# Patient Record
Sex: Female | Born: 1940
Health system: Southern US, Community
[De-identification: ages and names within clinical notes are randomized; demographics above are authoritative.]

## PROBLEM LIST (undated history)

## (undated) DIAGNOSIS — I10 Essential (primary) hypertension: Secondary | ICD-10-CM

## (undated) DIAGNOSIS — E039 Hypothyroidism, unspecified: Secondary | ICD-10-CM

## (undated) DIAGNOSIS — M8580 Other specified disorders of bone density and structure, unspecified site: Secondary | ICD-10-CM

## (undated) DIAGNOSIS — M109 Gout, unspecified: Secondary | ICD-10-CM

## (undated) DIAGNOSIS — E782 Mixed hyperlipidemia: Secondary | ICD-10-CM

## (undated) DIAGNOSIS — H8109 Meniere's disease, unspecified ear: Secondary | ICD-10-CM

## (undated) DIAGNOSIS — R7301 Impaired fasting glucose: Secondary | ICD-10-CM

## (undated) DIAGNOSIS — H401290 Low-tension glaucoma, unspecified eye, stage unspecified: Secondary | ICD-10-CM

## (undated) HISTORY — DX: Low-tension glaucoma, unspecified eye, stage unspecified: H40.1290

## (undated) HISTORY — DX: Mixed hyperlipidemia: E78.2

## (undated) HISTORY — DX: Meniere's disease, unspecified ear: H81.09

## (undated) HISTORY — DX: Other specified disorders of bone density and structure, unspecified site: M85.80

## (undated) HISTORY — DX: Gout, unspecified: M10.9

## (undated) HISTORY — DX: Hypothyroidism, unspecified: E03.9

## (undated) HISTORY — DX: Essential (primary) hypertension: I10

## (undated) HISTORY — DX: Impaired fasting glucose: R73.01

## (undated) HISTORY — PX: GANGLION CYST EXCISION: SHX1691

---

## 2008-03-01 LAB — HM COLONOSCOPY

## 2009-04-01 DIAGNOSIS — M109 Gout, unspecified: Secondary | ICD-10-CM

## 2009-04-01 HISTORY — DX: Gout, unspecified: M10.9

## 2010-10-31 HISTORY — PX: ROTATOR CUFF REPAIR: SHX139

## 2011-04-22 ENCOUNTER — Other Ambulatory Visit: Payer: Self-pay | Admitting: Family Medicine

## 2011-04-22 ENCOUNTER — Ambulatory Visit (INDEPENDENT_AMBULATORY_CARE_PROVIDER_SITE_OTHER): Payer: Medicare Other | Admitting: Family Medicine

## 2011-04-22 ENCOUNTER — Encounter: Payer: Self-pay | Admitting: Family Medicine

## 2011-04-22 VITALS — BP 124/78 | HR 76 | Ht 61.0 in | Wt 144.0 lb

## 2011-04-22 DIAGNOSIS — M722 Plantar fascial fibromatosis: Secondary | ICD-10-CM

## 2011-04-22 DIAGNOSIS — R5381 Other malaise: Secondary | ICD-10-CM | POA: Diagnosis not present

## 2011-04-22 DIAGNOSIS — Z780 Asymptomatic menopausal state: Secondary | ICD-10-CM

## 2011-04-22 DIAGNOSIS — Z1231 Encounter for screening mammogram for malignant neoplasm of breast: Secondary | ICD-10-CM | POA: Diagnosis not present

## 2011-04-22 DIAGNOSIS — R635 Abnormal weight gain: Secondary | ICD-10-CM | POA: Diagnosis not present

## 2011-04-22 DIAGNOSIS — Z79899 Other long term (current) drug therapy: Secondary | ICD-10-CM

## 2011-04-22 DIAGNOSIS — E782 Mixed hyperlipidemia: Secondary | ICD-10-CM

## 2011-04-22 DIAGNOSIS — E559 Vitamin D deficiency, unspecified: Secondary | ICD-10-CM | POA: Diagnosis not present

## 2011-04-22 DIAGNOSIS — K219 Gastro-esophageal reflux disease without esophagitis: Secondary | ICD-10-CM

## 2011-04-22 DIAGNOSIS — Z0000 Encounter for general adult medical examination without abnormal findings: Secondary | ICD-10-CM | POA: Diagnosis not present

## 2011-04-22 DIAGNOSIS — I10 Essential (primary) hypertension: Secondary | ICD-10-CM | POA: Insufficient documentation

## 2011-04-22 DIAGNOSIS — M109 Gout, unspecified: Secondary | ICD-10-CM | POA: Diagnosis not present

## 2011-04-22 DIAGNOSIS — Z13820 Encounter for screening for osteoporosis: Secondary | ICD-10-CM

## 2011-04-22 LAB — CBC WITH DIFFERENTIAL/PLATELET
Basophils Absolute: 0 10*3/uL (ref 0.0–0.1)
Eosinophils Relative: 3 % (ref 0–5)
HCT: 44.7 % (ref 36.0–46.0)
Hemoglobin: 14.2 g/dL (ref 12.0–15.0)
Lymphocytes Relative: 31 % (ref 12–46)
Lymphs Abs: 2.1 10*3/uL (ref 0.7–4.0)
MCV: 92.5 fL (ref 78.0–100.0)
Monocytes Absolute: 0.4 10*3/uL (ref 0.1–1.0)
Monocytes Relative: 6 % (ref 3–12)
Neutro Abs: 4 10*3/uL (ref 1.7–7.7)
RBC: 4.83 MIL/uL (ref 3.87–5.11)
RDW: 13.5 % (ref 11.5–15.5)
WBC: 6.8 10*3/uL (ref 4.0–10.5)

## 2011-04-22 LAB — POCT URINALYSIS DIPSTICK
Blood, UA: NEGATIVE
Glucose, UA: NEGATIVE
Ketones, UA: NEGATIVE
Spec Grav, UA: 1.01
Urobilinogen, UA: NEGATIVE

## 2011-04-22 LAB — COMPREHENSIVE METABOLIC PANEL
Albumin: 4.8 g/dL (ref 3.5–5.2)
CO2: 23 mEq/L (ref 19–32)
Glucose, Bld: 80 mg/dL (ref 70–99)
Potassium: 4.1 mEq/L (ref 3.5–5.3)
Sodium: 138 mEq/L (ref 135–145)
Total Protein: 7.1 g/dL (ref 6.0–8.3)

## 2011-04-22 LAB — LIPID PANEL
Cholesterol: 149 mg/dL (ref 0–200)
Triglycerides: 195 mg/dL — ABNORMAL HIGH (ref <150)

## 2011-04-22 LAB — TSH: TSH: 3.027 u[IU]/mL (ref 0.350–4.500)

## 2011-04-22 MED ORDER — NAPROXEN 500 MG PO TABS: 500.0000 mg | ORAL_TABLET | Freq: Two times a day (BID) | ORAL | Status: DC

## 2011-04-22 NOTE — Progress Notes (Signed)
Kaitlyn Abbott is a 71 y.o. female who presents for a complete physical.  She has the following concerns:  Coughs when she eats--more when she eats sweets or carbs.  Denies heartburn or belching.  Denies any coughing or choking with just drinking water.  Hacking cough is immediately better when she finishes eating.  Cough is improved if she takes Prilosec or Pepcid.  L heel pain x 2 months.  Has been doing stretches, which helps some.  She thinks it is plantar fasciitis.  Shoe inserts don't fit in all of her shoes, but seems to help.  Increased pain when walking barefoot.  Sometimes gets sharp heel pain in the middle of the night which wakes her up (has occurred 2-3 times).  Hurts to get out of bed in the morning.  Hyperlipidemia--wants to see if she could possibly change to just 1 pill, rather than 2 rx's.  Previously took Lipitor, was changed to Vytorin, doesn't recall any side effects or problem, perhaps just ineffective. Wondering if she could stop the fenofibrate.  Never took Crestor.  Gout--had 2 episodes 2 years ago, none since being on uloric. Stopped (ran out) of uloric 6 weeks ago, and changed to a black cherry supplement.  No gout attack since off. Wondering if she needs to restart uric acid lowering meds.  If so, would prefer generic med.  Takes Lasix for her Meniere's.  Prior to diagnosis of Meniere's she had been on Lasix for high blood pressure.  When diagnosed with Meniere's dose was increased, and hasn't had any symptoms for 4-5 years.  Dietary changes (cutting back on salt, caffeine, processed meats) have also helped.  HTN--BP at home usually runs 118-120/70's.  Denies dizziness, headaches, muscle cramps.  Immunization History  Administered Date(s) Administered  . Influenza Split 11/29/2010  Had last tetanus (?type) in approx 2009--check old records for date/type when they arrive Never had Zostavax (but had h/o shingles in 2000-2001) Pneumovax 10/2009 per patient Last Pap smear:   03/2010 Last mammogram: 03/2010 Last colonoscopy: 2009.  Denies any h/o polyps Last DEXA: normal per pt, in a study at [facility], 8-10 years ago Dentist: regular Ophtho: every 6-12 months, last was 6 months ago Exercise: limited due to L heel pain, and prior to that was limited by her shoulder.   Past Medical History  Diagnosis Date  . Mixed hyperlipidemia   . Gout attack 2011  . Meniere disease   . Hypertension   . Low tension glaucoma     [facility]    Past Surgical History  Procedure Date  . Ganglion cyst excision bilateral, 20 years apart    bilateral wrists  . Rotator cuff repair 10/2010    Right (Dr. Thurston Hole)    History   Social History  . Marital Status: Married    Spouse Name: N/A    Number of Children: 2  . Years of Education: N/A   Occupational History  . retired Physiological scientist, Doctor, hospital)    Social History Main Topics  . Smoking status: Never Smoker   . Smokeless tobacco: Never Used  . Alcohol Use: Yes     3 drinks a month and occasion glass of wine.  . Drug Use: No  . Sexually Active: Yes -- Female partner(s)   Other Topics Concern  . Not on file   Social History Narrative   Lives with husband.  Daughter lives in [place], son in [place], [place].  No pets    Family History  Problem  Relation Age of Onset  . Diabetes Mother   . Hyperlipidemia Mother   . Hypertension Mother   . Arthritis Mother   . Hypothyroidism Mother   . Cancer Father     kidney CA, and bone CA  . Anxiety disorder Sister   . Hyperlipidemia Sister   . Hypertension Sister   . Arthritis Maternal Grandmother   . Heart disease Maternal Grandmother   . Diabetes Maternal Grandfather   . Heart disease Maternal Grandfather   . Stroke Maternal Grandfather   . Alcohol abuse Sister     Current outpatient prescriptions:bimatoprost (LUMIGAN) 0.01 % SOLN, Place 1 drop into both eyes at bedtime., Disp: , Rfl: ;  ezetimibe-simvastatin (VYTORIN) 10-40 MG per tablet, Take 1 tablet by mouth at  bedtime., Disp: , Rfl: ;  fenofibrate 54 MG tablet, Take 54 mg by mouth daily., Disp: , Rfl: ;  furosemide (LASIX) 20 MG tablet, Take 20 mg by mouth daily., Disp: , Rfl:  Nutritional Supplements CAPS, Take 1 capsule by mouth 2 (two) times daily. [employer] Black Cherry 500mg ., Disp: , Rfl: ;  potassium chloride SA (K-DUR,KLOR-CON) 20 MEQ tablet, Take 20 mEq by mouth daily., Disp: , Rfl: ;  febuxostat (ULORIC) 40 MG tablet, Take 80 mg by mouth daily., Disp: , Rfl:   Allergies  Allergen Reactions  . Penicillins Rash   ROS:  The patient denies anorexia, fever, headaches,  vision changes, decreased hearing, ear pain, sore throat, breast concerns, chest pain, palpitations, dizziness, syncope, dyspnea on exertion, cough, swelling, nausea, vomiting, diarrhea, constipation, abdominal pain, melena, hematochezia, indigestion/heartburn, hematuria, incontinence, dysuria, vaginal bleeding, discharge, or odor, but has slight external vaginal itch.  denies genital lesions,  numbness, tingling, weakness, tremor, suspicious skin lesions, depression, anxiety, abnormal bleeding/bruising, or enlarged lymph nodes.  +10 pound weight gain over last year. +arthritis in her hands, occasionally takes Aleve  PHYSICAL EXAM: BP 148/96  Pulse 76  Ht 5\' 1"  (1.549 m)  Wt 144 lb (65.318 kg)  BMI 27.21 kg/m2 124/78 on repeat General Appearance:    Alert, cooperative, no distress, appears stated age  Head:    Normocephalic, without obvious abnormality, atraumatic  Eyes:    PERRL, conjunctiva/corneas clear, EOM's intact, fundi    benign  Ears:    Normal TM's and external ear canals  Nose:   Nares normal, mucosa normal, no drainage or sinus   tenderness  Throat:   Lips, mucosa, and tongue normal; teeth and gums normal  Neck:   Supple, no lymphadenopathy;  thyroid:  no   enlargement/tenderness/nodules; no carotid   bruit or JVD  Back:    Spine nontender, no curvature, ROM normal, no CVA     tenderness  Lungs:     Clear to  auscultation bilaterally without wheezes, rales or     ronchi; respirations unlabored  Chest Wall:    No tenderness or deformity   Heart:    Regular rate and rhythm, S1 and S2 normal, no murmur, rub   or gallop  Breast Exam:    No tenderness, masses, or nipple discharge or inversion.      No axillary lymphadenopathy  Abdomen:     Soft, non-tender, nondistended, normoactive bowel sounds,    no masses, no hepatosplenomegaly  Genitalia:    Normal external genitalia without lesions.  Mild atrophic changes of external genitalia/labia majora. BUS and vagina normal; no cervical motion tenderness. No abnormal vaginal discharge.  Uterus and adnexa not enlarged, nontender, no masses.  Pap not performed  Rectal:    Normal tone, no masses or tenderness; guaiac negative stool  Extremities:   No clubbing, cyanosis or edema.  Tender at L anteromedial calcaneous.  No soft tissue swelling or warmth  Pulses:   2+ and symmetric all extremities  Skin:   Skin color, texture, turgor normal, no rashes or lesions  Lymph nodes:   Cervical, supraclavicular, and axillary nodes normal  Neurologic:   CNII-XII intact, normal strength, sensation and gait; reflexes 2+ and symmetric throughout          Psych:   Normal mood, affect, hygiene and grooming.    ASSESSMENT/PLAN: 1. Routine general medical examination at a health care facility  Visual acuity screening, POCT Urinalysis Dipstick  2. Weight gain  TSH  3. Other malaise and fatigue  CBC with Differential  4. Mixed hyperlipidemia  Comprehensive metabolic panel, Lipid panel   check baseline labs.  Pt would like to try and take fewer meds  5. Encounter for long-term (current) use of other medications  Comprehensive metabolic panel  6. Essential hypertension, benign     elevated today, but normal at home per history  7. Plantar fasciitis  naproxen (NAPROSYN) 500 MG tablet   left.    8. GERD (gastroesophageal reflux disease)     cough with eating, improved with PPI and  H2 blocker   PF--left.  Trial of NSAIDs, arch supports vs heel cups.  Consider seeing podiatrist for either custom orthotic vs cortisone shot.  Hyperlipidemia--check baseline labs.  If TG are normal, then consider changing to Crestor alone, rechecking lipids (and lfts) in 2-3 months, and if TG aren't at goal, may need to restart fenofibrate.  Gout--off uloric x 6 weeks.  Prefers generic medication if any medication is needed.  Check uric acid. If elevated, and normal kidney function, start allopurinol rather than Uloric.  Cough with eating--likely GERD given that symptoms improve with PPI or H2 blocker. Keep journal, continue PPI or H2 blocker daily for a few weeks.  Discussed monthly self breast exams and yearly mammograms; at least 30 minutes of aerobic activity at least 5 days/week; proper sunscreen use reviewed; healthy diet, including goals of calcium and vitamin D intake and alcohol recommendations (less than or equal to 1 drink/day) reviewed; regular seatbelt use; changing batteries in smoke detectors.  Immunization recommendations discussed. Await previous records for date and type of tetanus.  Shingles vaccine Rx given--to get at pharmacy.  Colonoscopy recommendations reviewed--UTD.  DEXA recommended.

## 2011-04-22 NOTE — Patient Instructions (Signed)

## 2011-04-24 ENCOUNTER — Other Ambulatory Visit: Payer: Self-pay | Admitting: *Deleted

## 2011-04-24 DIAGNOSIS — I10 Essential (primary) hypertension: Secondary | ICD-10-CM

## 2011-04-24 DIAGNOSIS — E559 Vitamin D deficiency, unspecified: Secondary | ICD-10-CM

## 2011-04-24 DIAGNOSIS — E782 Mixed hyperlipidemia: Secondary | ICD-10-CM

## 2011-04-24 DIAGNOSIS — Z79899 Other long term (current) drug therapy: Secondary | ICD-10-CM

## 2011-04-24 MED ORDER — FUROSEMIDE 20 MG PO TABS: 20.0000 mg | ORAL_TABLET | Freq: Every day | ORAL | Status: DC

## 2011-04-24 MED ORDER — POTASSIUM CHLORIDE CRYS ER 20 MEQ PO TBCR: 20.0000 meq | EXTENDED_RELEASE_TABLET | Freq: Every day | ORAL | Status: DC

## 2011-04-24 MED ORDER — ROSUVASTATIN CALCIUM 20 MG PO TABS: 20.0000 mg | ORAL_TABLET | Freq: Every day | ORAL | Status: DC

## 2011-04-24 MED ORDER — ALLOPURINOL 100 MG PO TABS: 100.0000 mg | ORAL_TABLET | Freq: Every day | ORAL | Status: DC

## 2011-04-24 MED ORDER — VITAMIN D (ERGOCALCIFEROL) 1.25 MG (50000 UNIT) PO CAPS: 50000.0000 [IU] | ORAL_CAPSULE | ORAL | Status: DC

## 2011-04-29 NOTE — Progress Notes (Signed)
Addended by: Debbrah Alar F on: 04/29/2011 01:00 PM   Modules accepted: Orders

## 2011-05-21 ENCOUNTER — Encounter: Payer: Self-pay | Admitting: Family Medicine

## 2011-05-21 DIAGNOSIS — H8109 Meniere's disease, unspecified ear: Secondary | ICD-10-CM | POA: Insufficient documentation

## 2011-05-22 ENCOUNTER — Encounter: Payer: Self-pay | Admitting: *Deleted

## 2011-07-05 ENCOUNTER — Ambulatory Visit
Admission: RE | Admit: 2011-07-05 | Discharge: 2011-07-05 | Disposition: A | Discharge status: Home / Self Care | Payer: BC Managed Care – PPO | Source: Ambulatory Visit | Attending: Family Medicine | Admitting: Family Medicine

## 2011-07-05 DIAGNOSIS — Z780 Asymptomatic menopausal state: Secondary | ICD-10-CM

## 2011-07-05 DIAGNOSIS — Z13820 Encounter for screening for osteoporosis: Secondary | ICD-10-CM | POA: Diagnosis not present

## 2011-07-05 DIAGNOSIS — Z1231 Encounter for screening mammogram for malignant neoplasm of breast: Secondary | ICD-10-CM

## 2011-07-05 LAB — HM DEXA SCAN: HM Dexa Scan: NORMAL

## 2011-07-22 ENCOUNTER — Other Ambulatory Visit: Payer: Medicare Other

## 2011-07-22 DIAGNOSIS — E782 Mixed hyperlipidemia: Secondary | ICD-10-CM | POA: Diagnosis not present

## 2011-07-22 DIAGNOSIS — E559 Vitamin D deficiency, unspecified: Secondary | ICD-10-CM

## 2011-07-22 DIAGNOSIS — Z79899 Other long term (current) drug therapy: Secondary | ICD-10-CM

## 2011-07-22 LAB — URIC ACID: Uric Acid, Serum: 5.7 mg/dL (ref 2.4–7.0)

## 2011-07-22 LAB — LIPID PANEL
HDL: 26 mg/dL — ABNORMAL LOW (ref >39)
LDL Cholesterol: 49 mg/dL (ref 0–99)
Total CHOL/HDL Ratio: 5.2 Ratio

## 2011-07-22 LAB — HEPATIC FUNCTION PANEL
AST: 15 U/L (ref 0–37)
Albumin: 4.1 g/dL (ref 3.5–5.2)
Total Bilirubin: 0.5 mg/dL (ref 0.3–1.2)

## 2011-07-25 ENCOUNTER — Encounter: Payer: Self-pay | Admitting: Family Medicine

## 2011-07-25 ENCOUNTER — Ambulatory Visit (INDEPENDENT_AMBULATORY_CARE_PROVIDER_SITE_OTHER): Payer: Medicare Other | Admitting: Family Medicine

## 2011-07-25 VITALS — BP 122/78 | HR 72 | Ht 61.0 in | Wt 150.0 lb

## 2011-07-25 DIAGNOSIS — I10 Essential (primary) hypertension: Secondary | ICD-10-CM

## 2011-07-25 DIAGNOSIS — M899 Disorder of bone, unspecified: Secondary | ICD-10-CM

## 2011-07-25 DIAGNOSIS — M8580 Other specified disorders of bone density and structure, unspecified site: Secondary | ICD-10-CM

## 2011-07-25 DIAGNOSIS — M109 Gout, unspecified: Secondary | ICD-10-CM | POA: Insufficient documentation

## 2011-07-25 DIAGNOSIS — M722 Plantar fascial fibromatosis: Secondary | ICD-10-CM

## 2011-07-25 DIAGNOSIS — E782 Mixed hyperlipidemia: Secondary | ICD-10-CM | POA: Diagnosis not present

## 2011-07-25 DIAGNOSIS — M949 Disorder of cartilage, unspecified: Secondary | ICD-10-CM

## 2011-07-25 MED ORDER — POTASSIUM CHLORIDE CRYS ER 20 MEQ PO TBCR: 20.0000 meq | EXTENDED_RELEASE_TABLET | Freq: Every day | ORAL | Status: DC

## 2011-07-25 MED ORDER — ALLOPURINOL 100 MG PO TABS: 100.0000 mg | ORAL_TABLET | Freq: Every day | ORAL | Status: DC

## 2011-07-25 MED ORDER — FUROSEMIDE 20 MG PO TABS: 20.0000 mg | ORAL_TABLET | Freq: Every day | ORAL | Status: DC

## 2011-07-25 MED ORDER — ALENDRONATE SODIUM 70 MG PO TABS: 70.0000 mg | ORAL_TABLET | ORAL | Status: AC

## 2011-07-25 MED ORDER — NAPROXEN 500 MG PO TABS: 500.0000 mg | ORAL_TABLET | Freq: Two times a day (BID) | ORAL | Status: AC

## 2011-07-25 MED ORDER — FENOFIBRATE 54 MG PO TABS: 54.0000 mg | ORAL_TABLET | Freq: Every day | ORAL | Status: DC

## 2011-07-25 MED ORDER — ROSUVASTATIN CALCIUM 20 MG PO TABS: 20.0000 mg | ORAL_TABLET | Freq: Every day | ORAL | Status: DC

## 2011-07-25 NOTE — Patient Instructions (Signed)
Restart the fenofibrate.  Try increasing fish oil to 3-4 per day if tolerated. Start alendronate to strengthen bones--stop it if causing chest pain, or if you develop difficulty swallowing

## 2011-07-25 NOTE — Progress Notes (Signed)
Patient presents for f/u labs, with complaints of some recurrent L heel pain. Also here to f/u on DEXA results.  Gout--started allopurinol, taking without side effects.  Denies gout flares  Hyperlipidemia, mixed.  Meds changed from Vytorin plus fenofibrate to Crestor 20mg  alone, also taking 1000 mg of fish oil daily.  Denies side effects. Has been following lowfat, low cholesterol diet.  Vitamin D deficiency--completed Rx replacement, and is now taking 1000 IU in addition to her Calcium + D supplement.  L plantar fasciitis--pain was relieved by Naprosyn.  Pain recurred for about the last 3 weeks.  Denies any change in shoes. Pain isn't as severe now as before.   Past Medical History  Diagnosis Date  . Mixed hyperlipidemia   . Gout attack 2011  . Meniere disease   . Hypertension   . Low tension glaucoma     [facility]  . Vitamin d deficiency     Past Surgical History  Procedure Date  . Ganglion cyst excision bilateral, 20 years apart    bilateral wrists  . Rotator cuff repair 10/2010    Right (Dr. Thurston Hole)    History   Social History  . Marital Status: Married    Spouse Name: N/A    Number of Children: 2  . Years of Education: N/A   Occupational History  . retired Physiological scientist, Doctor, hospital)    Social History Main Topics  . Smoking status: Never Smoker   . Smokeless tobacco: Never Used  . Alcohol Use: Yes     3 drinks a month and occasion glass of wine.  . Drug Use: No  . Sexually Active: Yes -- Female partner(s)   Other Topics Concern  . Not on file   Social History Narrative   Lives with husband.  Daughter lives in [place], son in [place], [place].  No pets.  Daughter is Ship broker    Family History  Problem Relation Age of Onset  . Diabetes Mother   . Hyperlipidemia Mother   . Hypertension Mother   . Arthritis Mother   . Hypothyroidism Mother   . Cancer Father     kidney CA, and bone CA  . Anxiety disorder Sister   . Hyperlipidemia Sister   .  Hypertension Sister   . Arthritis Maternal Grandmother   . Heart disease Maternal Grandmother   . Diabetes Maternal Grandfather   . Heart disease Maternal Grandfather   . Stroke Maternal Grandfather   . Alcohol abuse Sister    Current Outpatient Prescriptions on File Prior to Visit  Medication Sig Dispense Refill  . bimatoprost (LUMIGAN) 0.01 % SOLN Place 1 drop into both eyes at bedtime.      . Calcium Carbonate-Vitamin D (RA CALCIUM PLUS VITAMIN D) 600-400 MG-UNIT per tablet Take 1 tablet by mouth daily.      Marland Kitchen DISCONTD: allopurinol (ZYLOPRIM) 100 MG tablet Take 1 tablet (100 mg total) by mouth daily.  30 tablet  3  . DISCONTD: furosemide (LASIX) 20 MG tablet Take 1 tablet (20 mg total) by mouth daily.  90 tablet  0  . DISCONTD: potassium chloride SA (K-DUR,KLOR-CON) 20 MEQ tablet Take 1 tablet (20 mEq total) by mouth daily.  90 tablet  0  . DISCONTD: rosuvastatin (CRESTOR) 20 MG tablet Take 1 tablet (20 mg total) by mouth daily.  30 tablet  3  . DISCONTD: fenofibrate 54 MG tablet Take 54 mg by mouth daily.        Allergies  Allergen  Reactions  . Penicillins Rash    ROS:  Denies fevers, URI symptoms, chest pain, headaches, dizziness, GI complaints, dysphagia, joint pains (other than heel pain), or other problems.  Denies myalgias, skin rash/lesions  PHYSICAL EXAM: BP 122/78  Pulse 72  Ht 5\' 1"  (1.549 m)  Wt 150 lb (68.04 kg)  BMI 28.34 kg/m2 Well developed, pleasant female in no distress Neck: no lymphadenopathy Heart: regular rate and rhythm Lungs: clear bilaterally Abdomen: soft, nontender Extremities: mild tenderness at L plantar fascia insertion at anteromedial calcaneous Skin: no rash Psych: normal mood, affect, hygiene and grooming  Lab Results  Component Value Date   CHOL 134 07/22/2011   HDL 26* 07/22/2011   LDLCALC 49 07/22/2011   TRIG 295* 07/22/2011   CHOLHDL 5.2 07/22/2011   Lab Results  Component Value Date   ALT 12 07/22/2011   AST 15 07/22/2011   ALKPHOS  69 07/22/2011   BILITOT 0.5 07/22/2011   Vitamin D-OH 54 Uric acid 5.7  DEXA Results: ASSESSMENT: Patient's diagnostic category is LOW BONE MASS by WHO  Criteria.  FRACTURE RISK: MODERATE  FRAX: Based on the World Health Organization FRAX model, the 10  year probability of a major osteoporotic fracture is 18% The 10  year probability of a hip fracture is 4.2%.   ASSESSMENT/PLAN: 1. Essential hypertension, benign    2. Gout  allopurinol (ZYLOPRIM) 100 MG tablet  3. Mixed hyperlipidemia  rosuvastatin (CRESTOR) 20 MG tablet, fenofibrate 54 MG tablet, Lipid panel, Hepatic function panel  4. Unspecified essential hypertension  potassium chloride SA (K-DUR,KLOR-CON) 20 MEQ tablet, furosemide (LASIX) 20 MG tablet  5. Plantar fasciitis  naproxen (NAPROSYN) 500 MG tablet   left.    6. Osteopenia  alendronate (FOSAMAX) 70 MG tablet   Osteopenia--at risk for hip fracture.  Risks and benefits of bisphosphonates reviewed.  Discussed weekly vs monthly.  Elects to try alendronate weekly. Discussed importance of Vitamin D and calcium, as well as weight-bearing exercise. Vitamin D deficiency--adequately replaced.  Continue 1000 IU daily longterm.  PF--refill naproxen. F/u with podiatrist  Gout--uric acid level at goal on allopurinol, continue  Mixed hyperlipidemia--LDL at goal, but HDL lower and TG high, high ratio.  Continue Crestor.  Add back fenofibrate at low dose.  Encouraged daily exercise, consider increasing fish oil if tolerated.  Recheck lipids and lfts in 2 months.  OV 6 months Labs 2 months, fasting (letter if normal)

## 2011-07-29 ENCOUNTER — Encounter: Payer: Self-pay | Admitting: Family Medicine

## 2011-09-23 ENCOUNTER — Other Ambulatory Visit: Payer: Medicare Other

## 2011-09-23 ENCOUNTER — Encounter: Payer: Self-pay | Admitting: Family Medicine

## 2011-09-23 DIAGNOSIS — E782 Mixed hyperlipidemia: Secondary | ICD-10-CM

## 2011-09-23 LAB — LIPID PANEL
Cholesterol: 127 mg/dL (ref 0–200)
HDL: 32 mg/dL — ABNORMAL LOW (ref >39)
Total CHOL/HDL Ratio: 4 Ratio
Triglycerides: 207 mg/dL — ABNORMAL HIGH (ref <150)
VLDL: 41 mg/dL — ABNORMAL HIGH (ref 0–40)

## 2011-09-23 LAB — HEPATIC FUNCTION PANEL
ALT: 20 U/L (ref 0–35)
AST: 18 U/L (ref 0–37)
Albumin: 4.2 g/dL (ref 3.5–5.2)
Total Protein: 6.2 g/dL (ref 6.0–8.3)

## 2011-10-22 ENCOUNTER — Other Ambulatory Visit: Payer: Self-pay | Admitting: Family Medicine

## 2011-10-22 NOTE — Telephone Encounter (Signed)
done

## 2011-10-22 NOTE — Telephone Encounter (Signed)
Is this ok?

## 2011-11-05 DIAGNOSIS — H40059 Ocular hypertension, unspecified eye: Secondary | ICD-10-CM | POA: Diagnosis not present

## 2011-11-21 DIAGNOSIS — E119 Type 2 diabetes mellitus without complications: Secondary | ICD-10-CM | POA: Diagnosis not present

## 2011-11-21 DIAGNOSIS — H40019 Open angle with borderline findings, low risk, unspecified eye: Secondary | ICD-10-CM | POA: Diagnosis not present

## 2011-11-21 DIAGNOSIS — E039 Hypothyroidism, unspecified: Secondary | ICD-10-CM | POA: Diagnosis not present

## 2012-01-10 ENCOUNTER — Other Ambulatory Visit: Payer: Medicare Other

## 2012-01-14 ENCOUNTER — Other Ambulatory Visit: Payer: Medicare Other

## 2012-01-14 DIAGNOSIS — Z23 Encounter for immunization: Secondary | ICD-10-CM | POA: Diagnosis not present

## 2012-01-14 MED ORDER — INFLUENZA VIRUS VACC SPLIT PF IM SUSP: 0.5000 mL | Freq: Once | INTRAMUSCULAR | Status: AC

## 2012-01-27 ENCOUNTER — Telehealth: Payer: Self-pay | Admitting: Family Medicine

## 2012-01-27 DIAGNOSIS — E782 Mixed hyperlipidemia: Secondary | ICD-10-CM

## 2012-01-27 MED ORDER — ROSUVASTATIN CALCIUM 20 MG PO TABS: 20.0000 mg | ORAL_TABLET | Freq: Every day | ORAL | Status: DC

## 2012-01-27 NOTE — Telephone Encounter (Signed)
Sent to pharmacy in [place] [place]

## 2012-01-27 NOTE — Telephone Encounter (Signed)
Okay for #90, no refills, to requested pharmacy

## 2012-01-27 NOTE — Telephone Encounter (Signed)
Needs crestor Rx sent to [facility] in [place]  Store [phone]  She is going to be out of town until she sees you 03/23/12

## 2012-01-29 ENCOUNTER — Ambulatory Visit: Payer: Medicare Other | Admitting: Family Medicine

## 2012-03-09 ENCOUNTER — Ambulatory Visit: Payer: Medicare Other | Admitting: Family Medicine

## 2012-03-17 ENCOUNTER — Encounter: Payer: Self-pay | Admitting: Internal Medicine

## 2012-03-23 ENCOUNTER — Encounter: Payer: Self-pay | Admitting: Family Medicine

## 2012-03-23 ENCOUNTER — Ambulatory Visit (INDEPENDENT_AMBULATORY_CARE_PROVIDER_SITE_OTHER): Payer: Medicare Other | Admitting: Family Medicine

## 2012-03-23 VITALS — BP 128/88 | HR 64 | Ht 61.0 in | Wt 153.0 lb

## 2012-03-23 DIAGNOSIS — I10 Essential (primary) hypertension: Secondary | ICD-10-CM | POA: Diagnosis not present

## 2012-03-23 DIAGNOSIS — E559 Vitamin D deficiency, unspecified: Secondary | ICD-10-CM

## 2012-03-23 DIAGNOSIS — Z23 Encounter for immunization: Secondary | ICD-10-CM | POA: Diagnosis not present

## 2012-03-23 DIAGNOSIS — M949 Disorder of cartilage, unspecified: Secondary | ICD-10-CM | POA: Diagnosis not present

## 2012-03-23 DIAGNOSIS — E782 Mixed hyperlipidemia: Secondary | ICD-10-CM

## 2012-03-23 DIAGNOSIS — M899 Disorder of bone, unspecified: Secondary | ICD-10-CM | POA: Diagnosis not present

## 2012-03-23 DIAGNOSIS — M8580 Other specified disorders of bone density and structure, unspecified site: Secondary | ICD-10-CM

## 2012-03-23 LAB — COMPREHENSIVE METABOLIC PANEL
ALT: 20 U/L (ref 0–35)
Alkaline Phosphatase: 32 U/L — ABNORMAL LOW (ref 39–117)
CO2: 24 mEq/L (ref 19–32)
Creat: 0.9 mg/dL (ref 0.50–1.10)
Sodium: 135 mEq/L (ref 135–145)
Total Bilirubin: 0.5 mg/dL (ref 0.3–1.2)
Total Protein: 6.4 g/dL (ref 6.0–8.3)

## 2012-03-23 LAB — LIPID PANEL
Cholesterol: 128 mg/dL (ref 0–200)
LDL Cholesterol: 53 mg/dL (ref 0–99)
Total CHOL/HDL Ratio: 3.8 Ratio
VLDL: 41 mg/dL — ABNORMAL HIGH (ref 0–40)

## 2012-03-23 NOTE — Patient Instructions (Signed)
Continue your current medications.  Dr. Suszanne Conners or the folks at [facility] are local ENT's--not sure who specializes in meniere's but you can check with their offices.

## 2012-03-23 NOTE — Progress Notes (Signed)
Chief Complaint  Patient presents with  . Hypertension    6 month fasting follow up.   Hyperlipidemia--fenofibrate added back at last visit, in addition to Crestor.  Labs in June showed improvement, but TG still high at 207.  Due for recheck today. Denies myalgias.  A little stiffness in legs only when she first wakes in the morning, better once she moves around. Trying to follow lowfat, low cholesterol diet.  Gout--on allopurinol, and hasn't had any flares since prior to last visit.  Osteopenia--started on alendronate at last visit, and it tolerating it without any side effects.  Denies dysphagia, chest pain.  Had R hip pain while visiting her son in [place].  She got new shoes after being evaluated at running shoe store, and has been doing fine.  Rarely needs an aleve, which helps. No further problems with plantar fasciitis.  Vitamin D deficiency--taking it with her calcium, and is in her fish oil, and she takes separate vitamin D.  This is more than what she was taking at her last check, coming in at 4000 IU daily. Her fish oil contains 1000 IU in each, taking 2 daily, and has been taking x 3-4 months.  HTN--she was put on Lasix to control both her blood pressure and her meniere's disease.  She follows a low sodium diet. BP's at home are running 117-120's/70's.  Meniere's has been okay.  She is asking for recommendations for ENT's.  HM--can't recall last tetanus, many years.  None documented in previous records received/reviewed.  Past Medical History  Diagnosis Date  . Mixed hyperlipidemia   . Gout attack 2011  . Meniere disease   . Hypertension   . Low tension glaucoma     [facility]  . Vitamin D deficiency    Past Surgical History  Procedure Date  . Ganglion cyst excision bilateral, 20 years apart    bilateral wrists  . Rotator cuff repair 10/2010    Right (Dr. Thurston Hole)   History   Social History  . Marital Status: Married    Spouse Name: N/A    Number of Children: 2  .  Years of Education: N/A   Occupational History  . retired Physiological scientist, Doctor, hospital)    Social History Main Topics  . Smoking status: Never Smoker   . Smokeless tobacco: Never Used  . Alcohol Use: Yes     Comment: 3 drinks a month and occasion glass of wine.  . Drug Use: No  . Sexually Active: Yes -- Female partner(s)   Other Topics Concern  . Not on file   Social History Narrative   Lives with husband.  Daughter lives in [place], son in [place], [place].  No pets.  Daughter is Ship broker    Current outpatient prescriptions:alendronate (FOSAMAX) 70 MG tablet, Take 1 tablet (70 mg total) by mouth every 7 (seven) days. Take with a full glass of water on an empty stomach., Disp: 4 tablet, Rfl: 11;  allopurinol (ZYLOPRIM) 100 MG tablet, Take 1 tablet (100 mg total) by mouth daily., Disp: 90 tablet, Rfl: 3;  bimatoprost (LUMIGAN) 0.01 % SOLN, Place 1 drop into both eyes at bedtime., Disp: , Rfl:  Calcium Carbonate-Vitamin D (RA CALCIUM PLUS VITAMIN D) 600-400 MG-UNIT per tablet, Take 1 tablet by mouth daily., Disp: , Rfl: ;  cholecalciferol (VITAMIN D) 1000 UNITS tablet, Take 1,000 Units by mouth daily., Disp: , Rfl: ;  fenofibrate 54 MG tablet, TAKE 1 TABLET (54 MG TOTAL) BY MOUTH DAILY.,  Disp: 90 tablet, Rfl: 1;  Fish Oil-Cholecalciferol (FISH OIL-VITAMIN D PO), Take 2,400 mg by mouth daily., Disp: , Rfl:  furosemide (LASIX) 20 MG tablet, Take 1 tablet (20 mg total) by mouth daily., Disp: 90 tablet, Rfl: 3;  potassium chloride SA (K-DUR,KLOR-CON) 20 MEQ tablet, Take 1 tablet (20 mEq total) by mouth daily., Disp: 90 tablet, Rfl: 3;  rosuvastatin (CRESTOR) 20 MG tablet, Take 1 tablet (20 mg total) by mouth daily., Disp: 90 tablet, Rfl: 0 naproxen (NAPROSYN) 500 MG tablet, Take 1 tablet (500 mg total) by mouth 2 (two) times daily with a meal., Disp: 30 tablet, Rfl: 1 No current facility-administered medications for this visit. Facility-Administered Medications Ordered in Other Visits: influenza   inactive virus vaccine (FLUZONE/FLUARIX) injection 0.5 mL, 0.5 mL, Intramuscular, Once, Ronnald Nian, MD  Allergies  Allergen Reactions  . Penicillins Rash   ROS negative--some hearing loss in L ear.  No fevers, URI symptoms, headaches, chest pain, palpitations, shortness of breath, nausea, vomiting bowel changes, urinary complaints, skin rashes, bleeding/bruising, fatigue, or other concerns.  See HPI  PHYSICAL EXAM: BP 128/88  Pulse 64  Ht 5\' 1"  (1.549 m)  Wt 153 lb (69.4 kg)  BMI 28.91 kg/m2 Well developed, pleasant female in no distress Neck: no lymphadenopathy, thyromegaly or carotid bruit Heart: regular rate and rhythm without murmur Lungs: clear bilaterally Abdomen: soft, nontender, no organomegaly or mass Extremities: no edema, 2+ pulse Psych: normal mood, affect, hygiene and grooming Neuro: alert and oriented, cranial nerves grossly intact.  Normal gait, strength Skin: no rash  ASSESSMENT/PLAN:  1. Mixed hyperlipidemia  Lipid panel  2. Essential hypertension, benign  Comprehensive metabolic panel, Lipid panel  3. Osteopenia    4. Need for Tdap vaccination  Tdap vaccine greater than or equal to 7yo IM  5. Unspecified vitamin D deficiency  Vitamin D 25 hydroxy   Vitamin D ---taking significantly more than at last check, when D-OH was 54.  Let's make sure it isn't too much.  If significantly higher, she should cut out the regular Vitamin D while on fish oil containing Vitamin D.  CPE 6 months (med check plus/AWV)

## 2012-03-24 ENCOUNTER — Encounter: Payer: Self-pay | Admitting: Family Medicine

## 2012-03-24 MED ORDER — FENOFIBRATE 54 MG PO TABS: 54.0000 mg | ORAL_TABLET | Freq: Every day | ORAL | Status: DC

## 2012-03-24 MED ORDER — ROSUVASTATIN CALCIUM 20 MG PO TABS: 20.0000 mg | ORAL_TABLET | Freq: Every day | ORAL | Status: DC

## 2012-07-22 ENCOUNTER — Other Ambulatory Visit: Payer: Self-pay | Admitting: Family Medicine

## 2012-08-18 ENCOUNTER — Other Ambulatory Visit: Payer: Self-pay

## 2012-08-18 DIAGNOSIS — Z1231 Encounter for screening mammogram for malignant neoplasm of breast: Secondary | ICD-10-CM

## 2012-08-28 ENCOUNTER — Other Ambulatory Visit: Payer: Self-pay | Admitting: Family Medicine

## 2012-08-28 NOTE — Telephone Encounter (Signed)
CHECK WITH DR. Lynelle Doctor FOR FURTHER REFILLS.

## 2012-09-04 ENCOUNTER — Ambulatory Visit
Admission: RE | Admit: 2012-09-04 | Discharge: 2012-09-04 | Disposition: A | Discharge status: Home / Self Care | Payer: Medicare Other | Source: Ambulatory Visit

## 2012-09-04 DIAGNOSIS — Z1231 Encounter for screening mammogram for malignant neoplasm of breast: Secondary | ICD-10-CM | POA: Diagnosis not present

## 2012-09-04 LAB — HM MAMMOGRAPHY: HM Mammogram: NEGATIVE

## 2012-09-25 ENCOUNTER — Other Ambulatory Visit: Payer: Self-pay | Admitting: Family Medicine

## 2012-10-05 ENCOUNTER — Encounter: Payer: Self-pay | Admitting: Family Medicine

## 2012-10-05 ENCOUNTER — Ambulatory Visit (INDEPENDENT_AMBULATORY_CARE_PROVIDER_SITE_OTHER): Payer: Medicare Other | Admitting: Family Medicine

## 2012-10-05 ENCOUNTER — Other Ambulatory Visit (HOSPITAL_COMMUNITY)
Admission: RE | Admit: 2012-10-05 | Discharge: 2012-10-05 | Disposition: A | Discharge status: Home / Self Care | Payer: Medicare Other | Source: Ambulatory Visit | Attending: Family Medicine | Admitting: Family Medicine

## 2012-10-05 VITALS — BP 130/80 | HR 80 | Ht 61.0 in | Wt 154.0 lb

## 2012-10-05 DIAGNOSIS — Z79899 Other long term (current) drug therapy: Secondary | ICD-10-CM | POA: Diagnosis not present

## 2012-10-05 DIAGNOSIS — E782 Mixed hyperlipidemia: Secondary | ICD-10-CM | POA: Diagnosis not present

## 2012-10-05 DIAGNOSIS — Z0000 Encounter for general adult medical examination without abnormal findings: Secondary | ICD-10-CM

## 2012-10-05 DIAGNOSIS — I10 Essential (primary) hypertension: Secondary | ICD-10-CM

## 2012-10-05 DIAGNOSIS — Z01419 Encounter for gynecological examination (general) (routine) without abnormal findings: Secondary | ICD-10-CM | POA: Diagnosis not present

## 2012-10-05 DIAGNOSIS — K219 Gastro-esophageal reflux disease without esophagitis: Secondary | ICD-10-CM

## 2012-10-05 DIAGNOSIS — M8580 Other specified disorders of bone density and structure, unspecified site: Secondary | ICD-10-CM

## 2012-10-05 DIAGNOSIS — Z1151 Encounter for screening for human papillomavirus (HPV): Secondary | ICD-10-CM | POA: Insufficient documentation

## 2012-10-05 DIAGNOSIS — H8109 Meniere's disease, unspecified ear: Secondary | ICD-10-CM | POA: Diagnosis not present

## 2012-10-05 DIAGNOSIS — M109 Gout, unspecified: Secondary | ICD-10-CM

## 2012-10-05 DIAGNOSIS — M949 Disorder of cartilage, unspecified: Secondary | ICD-10-CM

## 2012-10-05 LAB — COMPREHENSIVE METABOLIC PANEL
Alkaline Phosphatase: 34 U/L — ABNORMAL LOW (ref 39–117)
BUN: 28 mg/dL — ABNORMAL HIGH (ref 6–23)
Glucose, Bld: 96 mg/dL (ref 70–99)
Sodium: 138 mEq/L (ref 135–145)
Total Bilirubin: 0.5 mg/dL (ref 0.3–1.2)
Total Protein: 6.7 g/dL (ref 6.0–8.3)

## 2012-10-05 LAB — LIPID PANEL
Cholesterol: 140 mg/dL (ref 0–200)
Total CHOL/HDL Ratio: 3.9 Ratio
VLDL: 36 mg/dL (ref 0–40)

## 2012-10-05 LAB — HM PAP SMEAR: HM Pap smear: NEGATIVE

## 2012-10-05 MED ORDER — ALENDRONATE SODIUM 70 MG PO TABS: ORAL_TABLET | ORAL | Status: DC

## 2012-10-05 MED ORDER — FENOFIBRATE 54 MG PO TABS: 54.0000 mg | ORAL_TABLET | Freq: Every day | ORAL | Status: DC

## 2012-10-05 MED ORDER — POTASSIUM CHLORIDE CRYS ER 20 MEQ PO TBCR: 20.0000 meq | EXTENDED_RELEASE_TABLET | Freq: Every day | ORAL | Status: DC

## 2012-10-05 MED ORDER — ALLOPURINOL 100 MG PO TABS: ORAL_TABLET | ORAL | Status: DC

## 2012-10-05 MED ORDER — FUROSEMIDE 20 MG PO TABS: 20.0000 mg | ORAL_TABLET | Freq: Every day | ORAL | Status: DC

## 2012-10-05 NOTE — Patient Instructions (Addendum)

## 2012-10-05 NOTE — Progress Notes (Signed)
Chief Complaint  Patient presents with  . Med check plus    fasting med check plus with pelvic exam. No concerns.   Kaitlyn Abbott is a 72 y.o. female who presents for a complete physical.  She is also here for routine med check on her chronic medical problems.  She is fasting today for labs.  Hyperlipidemia follow-up:  Patient is reportedly following a low-fat, low cholesterol diet.  Compliant with medications and denies medication side effects  Hypertension follow-up:  Blood pressures elsewhere are 120's/70's.  Denies lightheadedness, headaches, chest pain.  Denies side effects of medications.  Takes Lasix for her Meniere's. Prior to diagnosis of Meniere's she had been on Lasix for high blood pressure. When diagnosed with Meniere's dose was increased, and hasn't had any symptoms for several years. Dietary changes (cutting back on salt, caffeine, processed meats) have also helped.  Gout: doing well on allopurinol.  Hasn't had a gout flare since 1.5-2 years ago.  Osteopenia:  On fosamax x 1.5 years.  Denies chest pain, jaw pain, or dysphagia.  GERD--no longer having cough with eating since taking omeprazole daily.  Can go a day or two without it and doesn't have recurrent symptoms.  She has Living Will and healthcare power of attorney.    Immunization History  Administered Date(s) Administered  . Influenza Split 11/29/2010, 01/14/2012  . Pneumococcal Polysaccharide 01/24/2010  . Tdap 03/23/2012  Never had Zostavax (but had h/o shingles in 2000-2001) --has been given rx, but never got vaccine Pneumovax 10/2009 per patient  Last Pap smear: 03/2010  Last mammogram: 08/2012 Last colonoscopy: 2009. Denies any h/o polyps  Last DEXA: 07/2011 (with elevated FRAX, T-2; alendronate started) Dentist: regular  Ophtho: every 6 months Exercise: walks most days/week, 45-60 minutes (4x/week).  Past Medical History  Diagnosis Date  . Mixed hyperlipidemia   . Gout attack 2011  . Meniere disease   .  Hypertension   . Low tension glaucoma     [facility]  . Vitamin D deficiency   . Osteopenia     Past Surgical History  Procedure Laterality Date  . Ganglion cyst excision  bilateral, 20 years apart    bilateral wrists  . Rotator cuff repair  10/2010    Right (Dr. Thurston Hole)    History   Social History  . Marital Status: Married    Spouse Name: N/A    Number of Children: 2  . Years of Education: N/A   Occupational History  . retired Physiological scientist, Doctor, hospital)    Social History Main Topics  . Smoking status: Never Smoker   . Smokeless tobacco: Never Used  . Alcohol Use: Yes     Comment: 3 drinks a month and occasion glass of wine.  . Drug Use: No  . Sexually Active: Yes -- Female partner(s)   Other Topics Concern  . Not on file   Social History Narrative   Lives with husband.  Daughter lives in [place], son in [place], [place].  No pets.  Daughter is Ship broker    Family History  Problem Relation Age of Onset  . Diabetes Mother   . Hyperlipidemia Mother   . Hypertension Mother   . Arthritis Mother   . Hypothyroidism Mother   . Cancer Father     kidney CA, and bone CA  . Anxiety disorder Sister   . Hyperlipidemia Sister   . Hypertension Sister   . Arthritis Maternal Grandmother   . Heart disease Maternal Grandmother   .  Diabetes Maternal Grandfather   . Heart disease Maternal Grandfather   . Stroke Maternal Grandfather   . Alcohol abuse Sister     Current outpatient prescriptions:alendronate (FOSAMAX) 70 MG tablet, TAKE 1 TABLET BY MOUTH EVERY 7 DAYS WITH A FULL GLASS OF WATER. TAKE ON AN EMPTY STOMACH, Disp: 12 tablet, Rfl: 3;  allopurinol (ZYLOPRIM) 100 MG tablet, TAKE 1 TABLET (100 MG TOTAL) BY MOUTH DAILY., Disp: 90 tablet, Rfl: 3;  bimatoprost (LUMIGAN) 0.01 % SOLN, Place 1 drop into both eyes at bedtime., Disp: , Rfl:  Calcium Carbonate-Vit D-Min (CALCIUM 1200) 1200-1000 MG-UNIT CHEW, Chew 1 tablet by mouth daily., Disp: , Rfl: ;  Cholecalciferol (VITAMIN  D) 1000 UNITS capsule, Take 1,000 Units by mouth daily., Disp: , Rfl: ;  CRESTOR 20 MG tablet, TAKE 1 TABLET (20 MG TOTAL) BY MOUTH DAILY., Disp: 90 tablet, Rfl: 1;  fenofibrate 54 MG tablet, Take 1 tablet (54 mg total) by mouth daily., Disp: 90 tablet, Rfl: 1 furosemide (LASIX) 20 MG tablet, Take 1 tablet (20 mg total) by mouth daily., Disp: 90 tablet, Rfl: 3;  Omega-3 Fatty Acids (FISH OIL) 300 MG CAPS, Take 600 mg by mouth daily., Disp: , Rfl: ;  omeprazole (PRILOSEC) 20 MG capsule, Take 20 mg by mouth daily., Disp: , Rfl: ;  potassium chloride SA (K-DUR,KLOR-CON) 20 MEQ tablet, Take 1 tablet (20 mEq total) by mouth daily., Disp: 90 tablet, Rfl: 3 No current facility-administered medications for this visit. Facility-Administered Medications Ordered in Other Visits: influenza  inactive virus vaccine (FLUZONE/FLUARIX) injection 0.5 mL, 0.5 mL, Intramuscular, Once, Ronnald Nian, MD  Allergies  Allergen Reactions  . Penicillins Rash   ROS: The patient denies anorexia, fever, headaches, vision changes, decreased hearing, ear pain, sore throat, breast concerns, chest pain, palpitations, dizziness, syncope, dyspnea on exertion, cough, swelling, nausea, vomiting, diarrhea, constipation, abdominal pain, melena, hematochezia, indigestion/heartburn, hematuria, incontinence, dysuria, vaginal bleeding, discharge, or odor, denies genital lesions, numbness, tingling, weakness, tremor, suspicious skin lesions, depression, anxiety, abnormal bleeding/bruising, or enlarged lymph nodes.   +arthritis in her hands, occasionally takes Aleve   PHYSICAL EXAM: BP 130/80  Pulse 80  Ht 5\' 1"  (1.549 m)  Wt 154 lb (69.854 kg)  BMI 29.11 kg/m2  General Appearance:  Alert, cooperative, no distress, appears stated age   Head:  Normocephalic, without obvious abnormality, atraumatic   Eyes:  PERRL, conjunctiva/corneas clear, EOM's intact, fundi  benign   Ears:  Normal TM's and external ear canals   Nose:  Nares normal,  mucosa normal, no drainage or sinus tenderness   Throat:  Lips, mucosa, and tongue normal; teeth and gums normal   Neck:  Supple, no lymphadenopathy; thyroid: no enlargement/tenderness/nodules; no carotid  bruit or JVD   Back:  Spine nontender, no curvature, ROM normal, no CVA tenderness   Lungs:  Clear to auscultation bilaterally without wheezes, rales or ronchi; respirations unlabored   Chest Wall:  No tenderness or deformity   Heart:  Regular rate and rhythm, S1 and S2 normal, no murmur, rub  or gallop   Breast Exam:  No tenderness, masses, or nipple discharge or inversion. No axillary lymphadenopathy   Abdomen:  Soft, non-tender, nondistended, normoactive bowel sounds,  no masses, no hepatosplenomegaly   Genitalia:  Normal external genitalia without lesions. Mild atrophic changes of external genitalia/labia majora. BUS and vagina normal; no cervical motion tenderness. No abnormal vaginal discharge. Uterus and adnexa not enlarged, nontender, no masses. cervix normal without lesions. Pap performed   Rectal:  Normal  tone, no masses or tenderness; guaiac negative stool   Extremities:  No clubbing, cyanosis or edema. Tender at L anteromedial calcaneous. No soft tissue swelling or warmth   Pulses:  2+ and symmetric all extremities   Skin:  Skin color, texture, turgor normal, no rashes or lesions   Lymph nodes:  Cervical, supraclavicular, and axillary nodes normal   Neurologic:  CNII-XII intact, normal strength, sensation and gait; reflexes 2+ and symmetric throughout          Psych: Normal mood, affect, hygiene and grooming.   ASSESSMENT/PLAN:   Discussed monthly self breast exams and yearly mammograms; at least 30 minutes of aerobic activity at least 5 days/week; proper sunscreen use reviewed; healthy diet, including goals of calcium and vitamin D intake and alcohol recommendations (less than or equal to 1 drink/day) reviewed; regular seatbelt use; changing batteries in smoke detectors.  Immunization recommendations discussed.  Colonoscopy recommendations reviewed--UTD.   Pt reports that her insurance has always covered physicals, and 04/2011 was covered.   Reviewed shingles vaccine again.  6 months

## 2012-10-06 ENCOUNTER — Encounter: Payer: Self-pay | Admitting: Family Medicine

## 2012-10-22 ENCOUNTER — Other Ambulatory Visit: Payer: Self-pay | Admitting: Family Medicine

## 2012-11-04 DIAGNOSIS — H40019 Open angle with borderline findings, low risk, unspecified eye: Secondary | ICD-10-CM | POA: Diagnosis not present

## 2012-11-05 ENCOUNTER — Telehealth: Payer: Self-pay | Admitting: Family Medicine

## 2012-11-05 NOTE — Telephone Encounter (Signed)
Called pharmacy and this was an error.

## 2012-11-05 NOTE — Telephone Encounter (Signed)
rx was done in July for #90 with 3 refills.  Shouldn't need

## 2012-11-05 NOTE — Telephone Encounter (Signed)
Rx refill request received from [facility]   Furosemide 20 mg  90 day supply

## 2012-12-24 DIAGNOSIS — H40019 Open angle with borderline findings, low risk, unspecified eye: Secondary | ICD-10-CM | POA: Diagnosis not present

## 2013-01-26 ENCOUNTER — Other Ambulatory Visit (INDEPENDENT_AMBULATORY_CARE_PROVIDER_SITE_OTHER): Payer: Medicare Other

## 2013-01-26 DIAGNOSIS — Z23 Encounter for immunization: Secondary | ICD-10-CM | POA: Diagnosis not present

## 2013-01-27 DIAGNOSIS — H40019 Open angle with borderline findings, low risk, unspecified eye: Secondary | ICD-10-CM | POA: Diagnosis not present

## 2013-03-03 DIAGNOSIS — H40019 Open angle with borderline findings, low risk, unspecified eye: Secondary | ICD-10-CM | POA: Diagnosis not present

## 2013-03-31 ENCOUNTER — Other Ambulatory Visit: Payer: Self-pay | Admitting: Family Medicine

## 2013-04-08 ENCOUNTER — Ambulatory Visit (INDEPENDENT_AMBULATORY_CARE_PROVIDER_SITE_OTHER): Payer: Medicare Other | Admitting: Family Medicine

## 2013-04-08 ENCOUNTER — Encounter: Payer: Self-pay | Admitting: Family Medicine

## 2013-04-08 VITALS — BP 128/80 | HR 64 | Ht 61.0 in | Wt 158.0 lb

## 2013-04-08 DIAGNOSIS — K219 Gastro-esophageal reflux disease without esophagitis: Secondary | ICD-10-CM

## 2013-04-08 DIAGNOSIS — M899 Disorder of bone, unspecified: Secondary | ICD-10-CM

## 2013-04-08 DIAGNOSIS — M8580 Other specified disorders of bone density and structure, unspecified site: Secondary | ICD-10-CM

## 2013-04-08 DIAGNOSIS — I10 Essential (primary) hypertension: Secondary | ICD-10-CM | POA: Diagnosis not present

## 2013-04-08 DIAGNOSIS — H8109 Meniere's disease, unspecified ear: Secondary | ICD-10-CM

## 2013-04-08 DIAGNOSIS — R6889 Other general symptoms and signs: Secondary | ICD-10-CM | POA: Diagnosis not present

## 2013-04-08 DIAGNOSIS — M949 Disorder of cartilage, unspecified: Secondary | ICD-10-CM

## 2013-04-08 DIAGNOSIS — M109 Gout, unspecified: Secondary | ICD-10-CM

## 2013-04-08 DIAGNOSIS — E039 Hypothyroidism, unspecified: Secondary | ICD-10-CM | POA: Diagnosis not present

## 2013-04-08 DIAGNOSIS — E782 Mixed hyperlipidemia: Secondary | ICD-10-CM

## 2013-04-08 DIAGNOSIS — Z79899 Other long term (current) drug therapy: Secondary | ICD-10-CM

## 2013-04-08 DIAGNOSIS — R7989 Other specified abnormal findings of blood chemistry: Secondary | ICD-10-CM

## 2013-04-08 MED ORDER — ROSUVASTATIN CALCIUM 20 MG PO TABS: ORAL_TABLET | ORAL | Status: DC

## 2013-04-08 MED ORDER — FENOFIBRATE 54 MG PO TABS: 54.0000 mg | ORAL_TABLET | Freq: Every day | ORAL | Status: DC

## 2013-04-08 NOTE — Progress Notes (Signed)
Chief Complaint  Patient presents with  . Hypertension    fasting med check.    Hyperlipidemia follow-up: Patient is reportedly following a low-fat, low cholesterol diet. Compliant with medications and denies medication side effects.  Due for fasting labs.  Hypertension follow-up: Blood pressures elsewhere are 120's/70's. Denies lightheadedness, headaches, chest pain. Denies side effects of medications.  Takes Lasix for her Meniere's. Prior to diagnosis of Meniere's she had been on Lasix for high blood pressure. When diagnosed with Meniere's dose was increased, and hasn't had any symptoms for several years.  Follows a low sodium diet.  Gout: doing well on allopurinol. Hasn't had a gout flare since 2 years ago.   Osteopenia: On fosamax x 2 years. Denies chest pain, jaw pain, or dysphagia.  Every other week after taking the medication she might have a headache and some GI effects.  Symptoms subside by lunchtime.  GERD--no longer having cough with eating since taking omeprazole daily. Can go a day or two without it and doesn't have recurrent symptoms. She remembers to take it most days.   Past Medical History  Diagnosis Date  . Mixed hyperlipidemia   . Gout attack 2011  . Meniere disease   . Hypertension   . Low tension glaucoma     [facility]  . Vitamin D deficiency   . Osteopenia    Past Surgical History  Procedure Laterality Date  . Ganglion cyst excision  bilateral, 20 years apart    bilateral wrists  . Rotator cuff repair  10/2010    Right (Dr. Thurston Hole)   History   Social History  . Marital Status: Married    Spouse Name: N/A    Number of Children: 2  . Years of Education: N/A   Occupational History  . retired Physiological scientist, Doctor, hospital)    Social History Main Topics  . Smoking status: Never Smoker   . Smokeless tobacco: Never Used  . Alcohol Use: Yes     Comment: 3 drinks a month and occasion glass of wine.  . Drug Use: No  . Sexual Activity: Yes   Partners: Male   Other Topics Concern  . Not on file   Social History Narrative   Lives with husband.  Daughter lives in [place], son in [place], [place].  No pets.  Daughter is Ship broker.  Babysits her granddaughters   Current outpatient prescriptions:alendronate (FOSAMAX) 70 MG tablet, TAKE 1 TABLET BY MOUTH EVERY 7 DAYS WITH A FULL GLASS OF WATER. TAKE ON AN EMPTY STOMACH, Disp: 12 tablet, Rfl: 3;  allopurinol (ZYLOPRIM) 100 MG tablet, TAKE 1 TABLET (100 MG TOTAL) BY MOUTH DAILY., Disp: 90 tablet, Rfl: 3;  Calcium Carbonate-Vit D-Min (CALCIUM 1200) 1200-1000 MG-UNIT CHEW, Chew 1 tablet by mouth daily., Disp: , Rfl:  Cholecalciferol (VITAMIN D) 1000 UNITS capsule, Take 1,000 Units by mouth daily., Disp: , Rfl: ;  CRESTOR 20 MG tablet, TAKE 1 TABLET (20 MG TOTAL) BY MOUTH DAILY., Disp: 90 tablet, Rfl: 0;  fenofibrate 54 MG tablet, Take 1 tablet (54 mg total) by mouth daily., Disp: 90 tablet, Rfl: 1;  furosemide (LASIX) 20 MG tablet, Take 1 tablet (20 mg total) by mouth daily., Disp: 90 tablet, Rfl: 3 Omega-3 Fatty Acids (FISH OIL) 1200 MG CAPS, Take 2 capsules by mouth daily., Disp: , Rfl: ;  omeprazole (PRILOSEC) 20 MG capsule, Take 20 mg by mouth daily., Disp: , Rfl: ;  potassium chloride SA (K-DUR,KLOR-CON) 20 MEQ tablet, Take 1 tablet (20 mEq total)  by mouth daily., Disp: 90 tablet, Rfl: 3;  Travoprost, BAK Free, (TRAVATAN) 0.004 % SOLN ophthalmic solution, Place 1 drop into both eyes at bedtime., Disp: , Rfl:  bimatoprost (LUMIGAN) 0.01 % SOLN, Place 1 drop into both eyes at bedtime., Disp: , Rfl:  No current facility-administered medications for this visit. Facility-Administered Medications Ordered in Other Visits: influenza  inactive virus vaccine (FLUZONE/FLUARIX) injection 0.5 mL, 0.5 mL, Intramuscular, Once, Ronnald NianJohn C Lalonde, MD  Allergies  Allergen Reactions  . Penicillins Rash   ROS:  Denies fatigue, weight changes, headaches, dizziness, chest pain, palpitations, fatigue, bowel changes,  urinary complaints, bleeding, bruising, rashes.  No heartburn, dysphagia.  No joint pains, depression or other concerns. No fevers, chills, URI symptoms, cough, shortness of breath. See HPI.  PHYSICAL EXAM: BP 128/80  Pulse 64  Ht 5\' 1"  (1.549 m)  Wt 158 lb (71.668 kg)  BMI 29.87 kg/m2 Very pleasant overweight female in no distress Neck: no lymphadenopathy, thyromegaly or carotid bruit Heart: regular rate and rhythm, no murmur Lungs: clear bilaterally Back: no spine or CVA tenderness Abdomen: soft, nontender, no organomegaly or mass Extremities: no edema, 2+ pulses Skin: no rashes, suspicious lesions Psych: normal mood, affect, hygiene and grooming Neuro: alert and oriented. Cranial nerves intact. Normal strength, gait, sensation  ASSESSMENT/PLAN:  Essential hypertension, benign - controlled - Plan: Comprehensive metabolic panel  Mixed hyperlipidemia - Plan: Lipid panel, Comprehensive metabolic panel, rosuvastatin (CRESTOR) 20 MG tablet, fenofibrate 54 MG tablet  Osteopenia - Plan: DG Bone Density  Abnormal TSH - Plan: TSH, Thyroid Peroxidase Antibody  Unspecified hypothyroidism - Plan: TSH, Thyroid Peroxidase Antibody  GERD (gastroesophageal reflux disease) - controlled  Meniere's disease, unspecified laterality - asymptomatic, stable  Gout - asymptomatic since on allopurinol  F/u 6 months for CPE/med check, with fasting labs prior

## 2013-04-08 NOTE — Patient Instructions (Signed)
Continue current medications.  Try and get at least 30 minutes of exercise daily. We will contact you via MyChart with your results in the next day or two.

## 2013-04-09 LAB — THYROID PEROXIDASE ANTIBODY: Thyroperoxidase Ab SerPl-aCnc: 77.7 IU/mL — ABNORMAL HIGH (ref <35.0)

## 2013-04-09 LAB — LIPID PANEL
Cholesterol: 145 mg/dL (ref 0–200)
HDL: 36 mg/dL — AB (ref >39)
LDL CALC: 64 mg/dL (ref 0–99)
Total CHOL/HDL Ratio: 4 Ratio
Triglycerides: 223 mg/dL — ABNORMAL HIGH (ref <150)
VLDL: 45 mg/dL — ABNORMAL HIGH (ref 0–40)

## 2013-04-09 LAB — COMPREHENSIVE METABOLIC PANEL
ALK PHOS: 34 U/L — AB (ref 39–117)
ALT: 33 U/L (ref 0–35)
AST: 23 U/L (ref 0–37)
Albumin: 4.3 g/dL (ref 3.5–5.2)
BILIRUBIN TOTAL: 0.6 mg/dL (ref 0.3–1.2)
BUN: 23 mg/dL (ref 6–23)
CO2: 28 meq/L (ref 19–32)
Calcium: 9.3 mg/dL (ref 8.4–10.5)
Chloride: 101 mEq/L (ref 96–112)
Creat: 0.78 mg/dL (ref 0.50–1.10)
Glucose, Bld: 104 mg/dL — ABNORMAL HIGH (ref 70–99)
Potassium: 4.3 mEq/L (ref 3.5–5.3)
SODIUM: 136 meq/L (ref 135–145)
TOTAL PROTEIN: 6.8 g/dL (ref 6.0–8.3)

## 2013-04-09 LAB — TSH: TSH: 4.698 u[IU]/mL — AB (ref 0.350–4.500)

## 2013-04-23 DIAGNOSIS — H40019 Open angle with borderline findings, low risk, unspecified eye: Secondary | ICD-10-CM | POA: Diagnosis not present

## 2013-05-28 DIAGNOSIS — H40019 Open angle with borderline findings, low risk, unspecified eye: Secondary | ICD-10-CM | POA: Diagnosis not present

## 2013-09-30 ENCOUNTER — Other Ambulatory Visit: Payer: Self-pay | Admitting: Family Medicine

## 2013-09-30 DIAGNOSIS — Z1231 Encounter for screening mammogram for malignant neoplasm of breast: Secondary | ICD-10-CM

## 2013-10-04 ENCOUNTER — Other Ambulatory Visit: Payer: Medicare Other

## 2013-10-04 DIAGNOSIS — I10 Essential (primary) hypertension: Secondary | ICD-10-CM

## 2013-10-04 DIAGNOSIS — Z79899 Other long term (current) drug therapy: Secondary | ICD-10-CM | POA: Diagnosis not present

## 2013-10-04 DIAGNOSIS — E039 Hypothyroidism, unspecified: Secondary | ICD-10-CM | POA: Diagnosis not present

## 2013-10-04 DIAGNOSIS — M109 Gout, unspecified: Secondary | ICD-10-CM

## 2013-10-04 DIAGNOSIS — R7989 Other specified abnormal findings of blood chemistry: Secondary | ICD-10-CM

## 2013-10-04 DIAGNOSIS — E782 Mixed hyperlipidemia: Secondary | ICD-10-CM | POA: Diagnosis not present

## 2013-10-04 LAB — COMPREHENSIVE METABOLIC PANEL
ALK PHOS: 36 U/L — AB (ref 39–117)
ALT: 33 U/L (ref 0–35)
AST: 23 U/L (ref 0–37)
Albumin: 4.3 g/dL (ref 3.5–5.2)
BILIRUBIN TOTAL: 0.7 mg/dL (ref 0.2–1.2)
BUN: 24 mg/dL — AB (ref 6–23)
CO2: 28 mEq/L (ref 19–32)
CREATININE: 0.85 mg/dL (ref 0.50–1.10)
Calcium: 9.3 mg/dL (ref 8.4–10.5)
Chloride: 100 mEq/L (ref 96–112)
GLUCOSE: 104 mg/dL — AB (ref 70–99)
Potassium: 4 mEq/L (ref 3.5–5.3)
SODIUM: 139 meq/L (ref 135–145)
TOTAL PROTEIN: 6.4 g/dL (ref 6.0–8.3)

## 2013-10-04 LAB — CBC WITH DIFFERENTIAL/PLATELET
BASOS ABS: 0.1 10*3/uL (ref 0.0–0.1)
Basophils Relative: 1 % (ref 0–1)
EOS ABS: 0.2 10*3/uL (ref 0.0–0.7)
Eosinophils Relative: 3 % (ref 0–5)
HCT: 41.7 % (ref 36.0–46.0)
Hemoglobin: 14.1 g/dL (ref 12.0–15.0)
Lymphocytes Relative: 32 % (ref 12–46)
Lymphs Abs: 1.9 10*3/uL (ref 0.7–4.0)
MCH: 29.9 pg (ref 26.0–34.0)
MCHC: 33.8 g/dL (ref 30.0–36.0)
MCV: 88.5 fL (ref 78.0–100.0)
Monocytes Absolute: 0.6 10*3/uL (ref 0.1–1.0)
Monocytes Relative: 10 % (ref 3–12)
NEUTROS ABS: 3.2 10*3/uL (ref 1.7–7.7)
NEUTROS PCT: 54 % (ref 43–77)
Platelets: 218 10*3/uL (ref 150–400)
RBC: 4.71 MIL/uL (ref 3.87–5.11)
RDW: 14 % (ref 11.5–15.5)
WBC: 5.9 10*3/uL (ref 4.0–10.5)

## 2013-10-04 LAB — LIPID PANEL
CHOLESTEROL: 148 mg/dL (ref 0–200)
HDL: 32 mg/dL — ABNORMAL LOW (ref >39)
LDL Cholesterol: 71 mg/dL (ref 0–99)
TRIGLYCERIDES: 227 mg/dL — AB (ref <150)
Total CHOL/HDL Ratio: 4.6 Ratio
VLDL: 45 mg/dL — ABNORMAL HIGH (ref 0–40)

## 2013-10-04 LAB — URIC ACID: Uric Acid, Serum: 5.7 mg/dL (ref 2.4–7.0)

## 2013-10-05 LAB — TSH: TSH: 9.81 u[IU]/mL — ABNORMAL HIGH (ref 0.350–4.500)

## 2013-10-06 ENCOUNTER — Encounter: Payer: Self-pay | Admitting: Family Medicine

## 2013-10-06 ENCOUNTER — Ambulatory Visit (INDEPENDENT_AMBULATORY_CARE_PROVIDER_SITE_OTHER): Payer: Medicare Other | Admitting: Family Medicine

## 2013-10-06 VITALS — BP 148/82 | HR 80 | Ht 61.0 in | Wt 157.0 lb

## 2013-10-06 DIAGNOSIS — M899 Disorder of bone, unspecified: Secondary | ICD-10-CM | POA: Diagnosis not present

## 2013-10-06 DIAGNOSIS — Z0000 Encounter for general adult medical examination without abnormal findings: Secondary | ICD-10-CM

## 2013-10-06 DIAGNOSIS — H8109 Meniere's disease, unspecified ear: Secondary | ICD-10-CM

## 2013-10-06 DIAGNOSIS — E782 Mixed hyperlipidemia: Secondary | ICD-10-CM | POA: Diagnosis not present

## 2013-10-06 DIAGNOSIS — E039 Hypothyroidism, unspecified: Secondary | ICD-10-CM | POA: Diagnosis not present

## 2013-10-06 DIAGNOSIS — M949 Disorder of cartilage, unspecified: Secondary | ICD-10-CM | POA: Diagnosis not present

## 2013-10-06 DIAGNOSIS — K219 Gastro-esophageal reflux disease without esophagitis: Secondary | ICD-10-CM | POA: Diagnosis not present

## 2013-10-06 DIAGNOSIS — Z01419 Encounter for gynecological examination (general) (routine) without abnormal findings: Secondary | ICD-10-CM | POA: Diagnosis not present

## 2013-10-06 DIAGNOSIS — M1A0710 Idiopathic chronic gout, right ankle and foot, without tophus (tophi): Secondary | ICD-10-CM

## 2013-10-06 DIAGNOSIS — Z23 Encounter for immunization: Secondary | ICD-10-CM | POA: Diagnosis not present

## 2013-10-06 DIAGNOSIS — M8580 Other specified disorders of bone density and structure, unspecified site: Secondary | ICD-10-CM

## 2013-10-06 DIAGNOSIS — R7301 Impaired fasting glucose: Secondary | ICD-10-CM

## 2013-10-06 DIAGNOSIS — I10 Essential (primary) hypertension: Secondary | ICD-10-CM

## 2013-10-06 DIAGNOSIS — M1A00X0 Idiopathic chronic gout, unspecified site, without tophus (tophi): Secondary | ICD-10-CM

## 2013-10-06 MED ORDER — ALENDRONATE SODIUM 70 MG PO TABS: ORAL_TABLET | ORAL | Status: DC

## 2013-10-06 MED ORDER — POTASSIUM CHLORIDE CRYS ER 20 MEQ PO TBCR: 20.0000 meq | EXTENDED_RELEASE_TABLET | Freq: Every day | ORAL | Status: DC

## 2013-10-06 MED ORDER — ALLOPURINOL 100 MG PO TABS: ORAL_TABLET | ORAL | Status: DC

## 2013-10-06 MED ORDER — SYNTHROID 25 MCG PO TABS: 25.0000 ug | ORAL_TABLET | Freq: Every day | ORAL | Status: DC

## 2013-10-06 MED ORDER — ROSUVASTATIN CALCIUM 20 MG PO TABS: ORAL_TABLET | ORAL | Status: DC

## 2013-10-06 MED ORDER — FENOFIBRATE 54 MG PO TABS: 54.0000 mg | ORAL_TABLET | Freq: Every day | ORAL | Status: DC

## 2013-10-06 MED ORDER — FUROSEMIDE 20 MG PO TABS: 20.0000 mg | ORAL_TABLET | Freq: Every day | ORAL | Status: DC

## 2013-10-06 NOTE — Progress Notes (Signed)
Chief Complaint  Patient presents with  . Med check plus    nonfasting med check plus/AWV(labs already done) with pelvic exam. Mammo scheduled for 10/15/13 along with DEXA. No concerns.    Kaitlyn DueLinda Abbott is a 73 y.o. female who presents for follow-up on her chronic medical problems, as well as Annual Wellness Visit.    Meniere's disease--hasn't had any vertigo, just decreased hearing in the left ear (chronic, unchanged).  She hasn't seen ENT since moving here.  She takes her lasix and potassium without side effect, muscle cramps.  She periodically checks her BP's elsewhere, and they run in the low 120's/70's.   Mixed hyperlipidemia:  She was on vacation in June, ate out a lot, and admits to poor diet.  She reports compliance with her medications, and no side effects.  She is only taking 2 fish oil daily. She did a lot of walking while on vacation, and continues to walk regularly since her return.  Elevated TSH noted on recent labs, has been borderline in the past, but asymptomatic. She notes some dry skin that she attributed to being outside a lot this summer.  No significant fatigue, no other changes in hair, bowels, moods, no temperature intolerance or other symptoms.  Gout--no flare in a few years.  No side effects to allopurinol.  GERD:  She is taking the prilosec every other day, and has not had any recurrence of reflux symptoms (which was cough with eating; never had heartburn).  Denies dysphagia.  Annual Wellness Visit: Other doctors caring for patient: Ophtho: Dr. Martha ClanHutto at [facility] Dentist: Dr. Gennie Almaavid Menalevich Ortho: Dr. Thurston HoleWainer GI:  None locally, seen in [place] (would use Dr. Ewing SchleinMagod when Abbott again)  End of Life concerns:  She has a Living Will and MidwifeHealthcare Power of Attorney.  Depression and ADL screens--both entirely negative.  See scanned forms.  Immunization History  Administered Date(s) Administered  . Influenza Split 11/29/2010, 01/14/2012  . Influenza, High Dose Seasonal  PF 01/26/2013  . Pneumococcal Polysaccharide-23 01/24/2010  . Tdap 03/23/2012   Never had Zostavax (but had h/o shingles in 2000-2001); previously given written rx to get vaccine from pharmacy, but hasn't Last Pap smear: 09/2012, normal, with no high risk HPV Last mammogram:  08/2012 (scheduled for 7/17) Last colonoscopy: 2009. Denies any h/o polyps  Last DEXA: 07/2011, scheduled for 10/15/13 Dentist: regularly, twice yearly Ophtho: every 6 months Exercise: walks 3-4x/week for 2-2.5 miles, and she started using hand weights (just started this month).  Past Medical History  Diagnosis Date  . Mixed hyperlipidemia   . Gout attack 2011  . Meniere disease   . Hypertension   . Low tension glaucoma     [facility]  . Vitamin D deficiency   . Osteopenia    Past Surgical History  Procedure Laterality Date  . Ganglion cyst excision  bilateral, 20 years apart    bilateral wrists  . Rotator cuff repair  10/2010    Right (Dr. Thurston HoleWainer)   History   Social History  . Marital Status: Married    Spouse Name: N/A    Number of Children: 2  . Years of Education: N/A   Occupational History  . retired Physiological scientist(bank officer, Doctor, hospitaland secretarial)    Social History Main Topics  . Smoking status: Never Smoker   . Smokeless tobacco: Never Used  . Alcohol Use: Yes     Comment: 3 drinks a month and occasion glass of wine.  . Drug Use: No  .  Sexual Activity: Yes    Partners: Male   Other Topics Concern  . Not on file   Social History Narrative   Lives with husband.  Daughter lives in [place], son in [place], [place].  No pets.  Daughter is Ship broker.  Babysits her granddaughters   Family History  Problem Relation Age of Onset  . Diabetes Mother   . Hyperlipidemia Mother   . Hypertension Mother   . Arthritis Mother   . Hypothyroidism Mother   . Cancer Father     kidney CA, and bone CA  . Anxiety disorder Sister   . Hyperlipidemia Sister   . Hypertension Sister   . Arthritis Maternal Grandmother   .  Heart disease Maternal Grandmother   . Diabetes Maternal Grandfather   . Heart disease Maternal Grandfather   . Stroke Maternal Grandfather   . Alcohol abuse Sister     Outpatient Encounter Prescriptions as of 10/06/2013  Medication Sig Note  . alendronate (FOSAMAX) 70 MG tablet TAKE 1 TABLET BY MOUTH EVERY 7 DAYS WITH A FULL GLASS OF WATER. TAKE ON AN EMPTY STOMACH   . allopurinol (ZYLOPRIM) 100 MG tablet TAKE 1 TABLET (100 MG TOTAL) BY MOUTH DAILY.   . Calcium Carbonate-Vit D-Min (CALCIUM 1200) 1200-1000 MG-UNIT CHEW Chew 1 tablet by mouth daily.   . Cholecalciferol (VITAMIN D) 1000 UNITS capsule Take 1,000 Units by mouth daily.   . fenofibrate 54 MG tablet Take 1 tablet (54 mg total) by mouth daily.   . furosemide (LASIX) 20 MG tablet Take 1 tablet (20 mg total) by mouth daily.   . Omega-3 Fatty Acids (FISH OIL) 1200 MG CAPS Take 2 capsules by mouth daily.   Marland Kitchen omeprazole (PRILOSEC) 20 MG capsule Take 20 mg by mouth daily.   . potassium chloride SA (K-DUR,KLOR-CON) 20 MEQ tablet Take 1 tablet (20 mEq total) by mouth daily.   . rosuvastatin (CRESTOR) 20 MG tablet TAKE 1 TABLET (20 MG TOTAL) BY MOUTH DAILY.   Marland Kitchen timolol (BETIMOL) 0.5 % ophthalmic solution Place 1 drop into both eyes every morning.   . Travoprost, BAK Free, (TRAVATAN) 0.004 % SOLN ophthalmic solution Place 1 drop into both eyes at bedtime.   . [DISCONTINUED] alendronate (FOSAMAX) 70 MG tablet TAKE 1 TABLET BY MOUTH EVERY 7 DAYS WITH A FULL GLASS OF WATER. TAKE ON AN EMPTY STOMACH   . [DISCONTINUED] allopurinol (ZYLOPRIM) 100 MG tablet TAKE 1 TABLET (100 MG TOTAL) BY MOUTH DAILY.   . [DISCONTINUED] fenofibrate 54 MG tablet Take 1 tablet (54 mg total) by mouth daily.   . [DISCONTINUED] furosemide (LASIX) 20 MG tablet Take 1 tablet (20 mg total) by mouth daily. 10/06/2013: For Menieres  . [DISCONTINUED] potassium chloride SA (K-DUR,KLOR-CON) 20 MEQ tablet Take 1 tablet (20 mEq total) by mouth daily.   . [DISCONTINUED] rosuvastatin  (CRESTOR) 20 MG tablet TAKE 1 TABLET (20 MG TOTAL) BY MOUTH DAILY.   . SYNTHROID 25 MCG tablet Take 1 tablet (25 mcg total) by mouth daily before breakfast.   . [DISCONTINUED] bimatoprost (LUMIGAN) 0.01 % SOLN Place 1 drop into both eyes at bedtime.    Allergies  Allergen Reactions  . Penicillins Rash    ROS: The patient denies anorexia, weight changes, fever, headaches (occasionally gets a mild headache after taking her fosamax, mild); vision changes, decreased hearing (diminished on left, chronic/unchanged), ear pain, sore throat, breast concerns, chest pain, palpitations, dizziness, syncope, dyspnea on exertion, cough, swelling, nausea, vomiting, diarrhea, constipation, abdominal pain, melena, hematochezia, indigestion/heartburn,  hematuria, incontinence, dysuria, vaginal bleeding, discharge, or odor, but has slight external vaginal itch (chronic, unchanged). denies genital lesions, numbness, tingling, weakness, tremor, suspicious skin lesions, depression, anxiety, abnormal bleeding/bruising, or enlarged lymph nodes.  +arthritis in her hands, occasionally takes Aleve; no other joint pains.  No gout flares   PHYSICAL EXAM:  BP 148/82  Pulse 80  Ht  (1.549 m)  Wt 157 lb (71.215 kg)  BMI 29.68 kg/m2  General Appearance:  Alert, cooperative, no distress, appears stated age   Head:  Normocephalic, without obvious abnormality, atraumatic   Eyes:  PERRL, conjunctiva/corneas clear, EOM's intact, fundi  benign   Ears:  Normal TM's and external ear canals   Nose:  Nares normal, mucosa normal, no drainage or sinus tenderness   Throat:  Lips, mucosa, and tongue normal; teeth and gums normal   Neck:  Supple, no lymphadenopathy; thyroid: no enlargement/tenderness/nodules; no carotid  bruit or JVD   Back:  Spine nontender, no curvature, ROM normal, no CVA tenderness   Lungs:  Clear to auscultation bilaterally without wheezes, rales or ronchi; respirations unlabored   Chest Wall:  No  tenderness or deformity   Heart:  Regular rate and rhythm, S1 and S2 normal, no murmur, rub  or gallop   Breast Exam:  No tenderness, masses, or nipple discharge or inversion. No axillary lymphadenopathy   Abdomen:  Soft, non-tender, nondistended, normoactive bowel sounds,  no masses, no hepatosplenomegaly   Genitalia:  Normal external genitalia without lesions. Mild atrophic changes of external genitalia/labia majora, slightly pink/inflamed. BUS and vagina normal; no cervical motion tenderness. No abnormal vaginal discharge. Uterus and adnexa not enlarged, nontender, no masses. Pap not performed   Rectal:  Normal tone, no masses or tenderness; guaiac negative stool   Extremities:  No clubbing, cyanosis or edema. Tender at L anteromedial calcaneous. No soft tissue swelling or warmth   Pulses:  2+ and symmetric all extremities   Skin:  Skin color, texture, turgor normal, no rashes or lesions   Lymph nodes:  Cervical, supraclavicular, and axillary nodes normal   Neurologic:  CNII-XII intact, normal strength, sensation and gait; reflexes 2+ and symmetric throughout          Psych: Normal mood, affect, hygiene and grooming.    Lab Results  Component Value Date   CHOL 148 10/04/2013   HDL 32* 10/04/2013   LDLCALC 71 10/04/2013   TRIG 227* 10/04/2013   CHOLHDL 4.6 10/04/2013   Lab Results  Component Value Date   TSH 9.810* 10/04/2013   Lab Results  Component Value Date   WBC 5.9 10/04/2013   HGB 14.1 10/04/2013   HCT 41.7 10/04/2013   MCV 88.5 10/04/2013   PLT 218 10/04/2013   Uric acid 5.7    Chemistry      Component Value Date/Time   NA 139 10/04/2013 0846   K 4.0 10/04/2013 0846   CL 100 10/04/2013 0846   CO2 28 10/04/2013 0846   BUN 24* 10/04/2013 0846   CREATININE 0.85 10/04/2013 0846      Component Value Date/Time   CALCIUM 9.3 10/04/2013 0846   ALKPHOS 36* 10/04/2013 0846   AST 23 10/04/2013 0846   ALT 33 10/04/2013 0846   BILITOT 0.7 10/04/2013 0846     Fasting glucose  104  ASSESSMENT/PLAN:  Medicare annual wellness visit, initial  Mixed hyperlipidemia - borderine control.  Increase fish oil to 3000-4000mg  daily.  reviewed lowfat, low cholesterol diet.  continue rx meds - Plan: fenofibrate 54  MG tablet, rosuvastatin (CRESTOR) 20 MG tablet  Gastroesophageal reflux disease without esophagitis - doing well on qod PPI  Osteopenia - reviewed calcium, vitamin D, weight-bearing exercise. DEXA scheduled  - Plan: alendronate (FOSAMAX) 70 MG tablet  Unspecified hypothyroidism - newly diagnosed.  Reviewed proper way to take Synthroid, expected f/u, symptoms related to thyroid - Plan: SYNTHROID 25 MCG tablet  Need for prophylactic vaccination against Streptococcus pneumoniae (pneumococcus) - Plan: Pneumococcal conjugate vaccine 13-valent  Unspecified essential hypertension - She is on these meds for Meniere's; denies HTN.  BP elevated today, normal elsewhere.  Continue low sodium diet, encouraged weight loss, daily exercise - Plan: furosemide (LASIX) 20 MG tablet, potassium chloride SA (K-DUR,KLOR-CON) 20 MEQ tablet  Idiopathic chronic gout of right foot without tophus - well controlled without any flares and uric acid level at goal on low dose allopurinol - Plan: allopurinol (ZYLOPRIM) 100 MG tablet  Impaired fasting glucose - daily exercise, weight loss recommended.  diet reviewed  Meniere's disease, unspecified laterality - stable  Discussed monthly self breast exams and yearly mammograms; at least 30 minutes of aerobic activity at least 5 days/week; proper sunscreen use reviewed; healthy diet, including goals of calcium and vitamin D intake and alcohol recommendations (less than or equal to 1 drink/day) reviewed; regular seatbelt use; changing batteries in smoke detectors. Immunization recommendations discussed; yearly high dose flu shots are recommended.  Prevnar-13 today. Shingles vaccine Rx given--to get at pharmacy. Colonoscopy recommendations  reviewed--UTD.  Risks/side effects of shingles vaccine reviewed in detail, and re-wrote rx.  Hypothyroidism--start Synthroid  Recheck TSH and lipids in 2 months (lab only visit); further f/u to be determined based on those labs.

## 2013-10-06 NOTE — Patient Instructions (Signed)
HEALTH MAINTENANCE RECOMMENDATIONS:  It is recommended that you get at least 30 minutes of aerobic exercise at least 5 days/week (for weight loss, you may need as much as 60-90 minutes). This can be any activity that gets your heart rate up. This can be divided in 10-15 minute intervals if needed, but try and build up your endurance at least once a week.  Weight bearing exercise is also recommended twice weekly.  Eat a healthy diet with lots of vegetables, fruits and fiber.  "Colorful" foods have a lot of vitamins (ie green vegetables, tomatoes, red peppers, etc).  Limit sweet tea, regular sodas and alcoholic beverages, all of which has a lot of calories and sugar.  Up to 1 alcoholic drink daily may be beneficial for women (unless trying to lose weight, watch sugars).  Drink a lot of water.  Calcium recommendations are 1200-1500 mg daily (1500 mg for postmenopausal women or women without ovaries), and vitamin D 1000 IU daily.  This should be obtained from diet and/or supplements (vitamins), and calcium should not be taken all at once, but in divided doses.  Monthly self breast exams and yearly mammograms for women over the age of 72 is recommended.  Sunscreen of at least SPF 30 should be used on all sun-exposed parts of the skin when outside between the hours of 10 am and 4 pm (not just when at beach or pool, but even with exercise, golf, tennis, and yard work!)  Use a sunscreen that says "broad spectrum" so it covers both UVA and UVB rays, and make sure to reapply every 1-2 hours.  Remember to change the batteries in your smoke detectors when changing your clock times in the spring and fall.  Use your seat belt every time you are in a car, and please drive safely and not be distracted with cell phones and texting while driving.  Increase the fish oil to 3000-4000mg  daily, and try and cut back on the fat and sweets in the diet.  Get your shingles vaccine through the pharmacy.  Start the  Synthroid daily--on an empty stomach, separate from other medications.  Hypothyroidism The thyroid is a large gland located in the lower front of your neck. The thyroid gland helps control metabolism. Metabolism is how your body handles food. It controls metabolism with the hormone thyroxine. When this gland is underactive (hypothyroid), it produces too little hormone.  CAUSES These include:   Absence or destruction of thyroid tissue.  Goiter due to iodine deficiency.  Goiter due to medications.  Congenital defects (since birth).  Problems with the pituitary. This causes a lack of TSH (thyroid stimulating hormone). This hormone tells the thyroid to turn out more hormone. SYMPTOMS  Lethargy (feeling as though you have no energy)  Cold intolerance  Weight gain (in spite of normal food intake)  Dry skin  Coarse hair  Menstrual irregularity (if severe, may lead to infertility)  Slowing of thought processes Cardiac problems are also caused by insufficient amounts of thyroid hormone. Hypothyroidism in the newborn is cretinism, and is an extreme form. It is important that this form be treated adequately and immediately or it will lead rapidly to retarded physical and mental development. DIAGNOSIS  To prove hypothyroidism, your caregiver may do blood tests and ultrasound tests. Sometimes the signs are hidden. It may be necessary for your caregiver to watch this illness with blood tests either before or after diagnosis and treatment. TREATMENT  Low levels of thyroid hormone are increased by using  synthetic thyroid hormone. This is a safe, effective treatment. It usually takes about four weeks to gain the full effects of the medication. After you have the full effect of the medication, it will generally take another four weeks for problems to leave. Your caregiver may start you on low doses. If you have had heart problems the dose may be gradually increased. It is generally not an emergency  to get rapidly to normal. HOME CARE INSTRUCTIONS   Take your medications as your caregiver suggests. Let your caregiver know of any medications you are taking or start taking. Your caregiver will help you with dosage schedules.  As your condition improves, your dosage needs may increase. It will be necessary to have continuing blood tests as suggested by your caregiver.  Report all suspected medication side effects to your caregiver. SEEK MEDICAL CARE IF: Seek medical care if you develop:  Sweating.  Tremulousness (tremors).  Anxiety.  Rapid weight loss.  Heat intolerance.  Emotional swings.  Diarrhea.  Weakness. SEEK IMMEDIATE MEDICAL CARE IF:  You develop chest pain, an irregular heart beat (palpitations), or a rapid heart beat. MAKE SURE YOU:   Understand these instructions.  Will watch your condition.  Will get help right away if you are not doing well or get worse. Document Released: 03/18/2005 Document Revised: 06/10/2011 Document Reviewed: 11/06/2007 [facility] Patient Information 2015 [employer] [place]. This information is not intended to replace advice given to you by your health care provider. Make sure you discuss any questions you have with your health care provider.

## 2013-10-15 ENCOUNTER — Ambulatory Visit
Admission: RE | Admit: 2013-10-15 | Discharge: 2013-10-15 | Disposition: A | Discharge status: Home / Self Care | Payer: Medicare Other | Source: Ambulatory Visit | Attending: Family Medicine | Admitting: Family Medicine

## 2013-10-15 DIAGNOSIS — M949 Disorder of cartilage, unspecified: Secondary | ICD-10-CM | POA: Diagnosis not present

## 2013-10-15 DIAGNOSIS — M899 Disorder of bone, unspecified: Secondary | ICD-10-CM | POA: Diagnosis not present

## 2013-10-15 DIAGNOSIS — Z1231 Encounter for screening mammogram for malignant neoplasm of breast: Secondary | ICD-10-CM

## 2013-10-15 DIAGNOSIS — M8580 Other specified disorders of bone density and structure, unspecified site: Secondary | ICD-10-CM

## 2013-10-15 LAB — HM DEXA SCAN: HM Dexa Scan: NORMAL

## 2013-10-18 ENCOUNTER — Other Ambulatory Visit (INDEPENDENT_AMBULATORY_CARE_PROVIDER_SITE_OTHER): Payer: Medicare Other

## 2013-10-18 ENCOUNTER — Other Ambulatory Visit: Payer: Self-pay | Admitting: Family Medicine

## 2013-10-18 DIAGNOSIS — Z0000 Encounter for general adult medical examination without abnormal findings: Secondary | ICD-10-CM

## 2013-10-18 DIAGNOSIS — R928 Other abnormal and inconclusive findings on diagnostic imaging of breast: Secondary | ICD-10-CM

## 2013-10-18 DIAGNOSIS — Z1211 Encounter for screening for malignant neoplasm of colon: Secondary | ICD-10-CM | POA: Diagnosis not present

## 2013-10-18 LAB — POC HEMOCCULT BLD/STL (HOME/3-CARD/SCREEN)
Card #2 Fecal Occult Blod, POC: NEGATIVE
FECAL OCCULT BLD: NEGATIVE
FECAL OCCULT BLD: NEGATIVE

## 2013-10-21 ENCOUNTER — Encounter: Payer: Self-pay | Admitting: Internal Medicine

## 2013-10-22 ENCOUNTER — Ambulatory Visit
Admission: RE | Admit: 2013-10-22 | Discharge: 2013-10-22 | Disposition: A | Discharge status: Home / Self Care | Payer: Medicare Other | Source: Ambulatory Visit | Attending: Family Medicine | Admitting: Family Medicine

## 2013-10-22 DIAGNOSIS — R928 Other abnormal and inconclusive findings on diagnostic imaging of breast: Secondary | ICD-10-CM

## 2013-11-03 DIAGNOSIS — H40019 Open angle with borderline findings, low risk, unspecified eye: Secondary | ICD-10-CM | POA: Diagnosis not present

## 2013-11-28 ENCOUNTER — Other Ambulatory Visit: Payer: Self-pay | Admitting: Family Medicine

## 2013-12-08 ENCOUNTER — Other Ambulatory Visit: Payer: Medicare Other

## 2013-12-08 DIAGNOSIS — E782 Mixed hyperlipidemia: Secondary | ICD-10-CM

## 2013-12-08 DIAGNOSIS — E039 Hypothyroidism, unspecified: Secondary | ICD-10-CM

## 2013-12-09 LAB — LIPID PANEL
CHOL/HDL RATIO: 4 ratio
CHOLESTEROL: 128 mg/dL (ref 0–200)
HDL: 32 mg/dL — ABNORMAL LOW (ref >39)
LDL Cholesterol: 55 mg/dL (ref 0–99)
Triglycerides: 207 mg/dL — ABNORMAL HIGH (ref <150)
VLDL: 41 mg/dL — AB (ref 0–40)

## 2013-12-09 LAB — TSH: TSH: 9.76 u[IU]/mL — ABNORMAL HIGH (ref 0.350–4.500)

## 2013-12-09 MED ORDER — SYNTHROID 50 MCG PO TABS: 50.0000 ug | ORAL_TABLET | Freq: Every day | ORAL | Status: DC

## 2013-12-29 DIAGNOSIS — H40019 Open angle with borderline findings, low risk, unspecified eye: Secondary | ICD-10-CM | POA: Diagnosis not present

## 2013-12-30 ENCOUNTER — Other Ambulatory Visit (INDEPENDENT_AMBULATORY_CARE_PROVIDER_SITE_OTHER): Payer: Medicare Other

## 2013-12-30 DIAGNOSIS — Z23 Encounter for immunization: Secondary | ICD-10-CM | POA: Diagnosis not present

## 2014-01-31 ENCOUNTER — Encounter: Payer: Self-pay | Admitting: Family Medicine

## 2014-02-07 ENCOUNTER — Encounter: Payer: Self-pay | Admitting: Family Medicine

## 2014-02-07 ENCOUNTER — Other Ambulatory Visit: Payer: Self-pay | Admitting: *Deleted

## 2014-02-07 MED ORDER — SYNTHROID 50 MCG PO TABS: 50.0000 ug | ORAL_TABLET | Freq: Every day | ORAL | Status: DC

## 2014-02-15 ENCOUNTER — Other Ambulatory Visit: Payer: Medicare Other

## 2014-02-15 DIAGNOSIS — E039 Hypothyroidism, unspecified: Secondary | ICD-10-CM

## 2014-02-16 ENCOUNTER — Other Ambulatory Visit: Payer: Self-pay | Admitting: Family Medicine

## 2014-02-16 ENCOUNTER — Encounter: Payer: Self-pay | Admitting: Family Medicine

## 2014-02-16 LAB — TSH: TSH: 2.487 u[IU]/mL (ref 0.350–4.500)

## 2014-02-16 MED ORDER — SYNTHROID 50 MCG PO TABS: 50.0000 ug | ORAL_TABLET | Freq: Every day | ORAL | Status: DC

## 2014-02-17 ENCOUNTER — Other Ambulatory Visit: Payer: Self-pay | Admitting: *Deleted

## 2014-02-17 MED ORDER — SYNTHROID 50 MCG PO TABS: 50.0000 ug | ORAL_TABLET | Freq: Every day | ORAL | Status: DC

## 2014-02-17 NOTE — Telephone Encounter (Signed)
Done

## 2014-04-11 ENCOUNTER — Encounter: Payer: Self-pay | Admitting: Family Medicine

## 2014-04-11 ENCOUNTER — Other Ambulatory Visit: Payer: Self-pay | Admitting: *Deleted

## 2014-04-11 DIAGNOSIS — E782 Mixed hyperlipidemia: Secondary | ICD-10-CM

## 2014-04-11 MED ORDER — ROSUVASTATIN CALCIUM 20 MG PO TABS: ORAL_TABLET | ORAL | Status: DC

## 2014-04-11 NOTE — Telephone Encounter (Signed)
Ok to refill x 1 month.  Due for 6 month med check.  Come fasting, likely will recheck lipids given elevated TG on last check, and thyroid dose change since last checked.

## 2014-04-13 ENCOUNTER — Telehealth: Payer: Self-pay | Admitting: Family Medicine

## 2014-04-13 DIAGNOSIS — R7301 Impaired fasting glucose: Secondary | ICD-10-CM

## 2014-04-13 DIAGNOSIS — M109 Gout, unspecified: Secondary | ICD-10-CM

## 2014-04-13 DIAGNOSIS — I10 Essential (primary) hypertension: Secondary | ICD-10-CM

## 2014-04-13 DIAGNOSIS — E039 Hypothyroidism, unspecified: Secondary | ICD-10-CM | POA: Insufficient documentation

## 2014-04-13 DIAGNOSIS — M8580 Other specified disorders of bone density and structure, unspecified site: Secondary | ICD-10-CM

## 2014-04-13 DIAGNOSIS — E782 Mixed hyperlipidemia: Secondary | ICD-10-CM

## 2014-04-13 DIAGNOSIS — Z5181 Encounter for therapeutic drug level monitoring: Secondary | ICD-10-CM

## 2014-04-13 NOTE — Telephone Encounter (Signed)
Orders entered

## 2014-04-13 NOTE — Telephone Encounter (Signed)
Pt scheduled Med[facility] appt for 10/10/14. Pt would like to get fasting labs on 10/06/14 prior to appt. Is this ok?

## 2014-04-19 ENCOUNTER — Other Ambulatory Visit: Payer: Medicare Other

## 2014-04-19 ENCOUNTER — Telehealth: Payer: Self-pay | Admitting: Internal Medicine

## 2014-04-19 ENCOUNTER — Encounter: Payer: Self-pay | Admitting: Internal Medicine

## 2014-04-19 DIAGNOSIS — E782 Mixed hyperlipidemia: Secondary | ICD-10-CM | POA: Diagnosis not present

## 2014-04-19 DIAGNOSIS — E039 Hypothyroidism, unspecified: Secondary | ICD-10-CM

## 2014-04-19 LAB — HEPATIC FUNCTION PANEL
ALK PHOS: 28 U/L — AB (ref 39–117)
ALT: 25 U/L (ref 0–35)
AST: 21 U/L (ref 0–37)
Albumin: 3.9 g/dL (ref 3.5–5.2)
BILIRUBIN DIRECT: 0.1 mg/dL (ref 0.0–0.3)
Indirect Bilirubin: 0.6 mg/dL (ref 0.2–1.2)
Total Bilirubin: 0.7 mg/dL (ref 0.2–1.2)
Total Protein: 6.5 g/dL (ref 6.0–8.3)

## 2014-04-19 LAB — LIPID PANEL
CHOL/HDL RATIO: 4.2 ratio
CHOLESTEROL: 143 mg/dL (ref 0–200)
HDL: 34 mg/dL — ABNORMAL LOW (ref >39)
LDL CALC: 71 mg/dL (ref 0–99)
Triglycerides: 191 mg/dL — ABNORMAL HIGH (ref <150)
VLDL: 38 mg/dL (ref 0–40)

## 2014-04-19 LAB — TSH: TSH: 3.77 u[IU]/mL (ref 0.350–4.500)

## 2014-04-19 NOTE — Addendum Note (Signed)
Addended by: Herminio CommonsJOHNSON, Sukanya Goldblatt A on: 04/19/2014 10:24 AM   Modules accepted: Orders

## 2014-04-19 NOTE — Telephone Encounter (Signed)
Veronica told patient through my chart that patient needed to come in for a fasting med check within the next 30 days but pt is here today for fasting labs. Nothing is in the computer about needing labs today before appt. Please let me know what labs you need drawn for this patient so i can put them in and release it. Pt was notified that she would have to come in possibly for a med check after labs come back tomorrow and we call her with results.

## 2014-04-19 NOTE — Telephone Encounter (Signed)
Put in orders and released them for Alvino ChapelJo to send off. Sent pt a mychart message stating she needed to call and schedule an appt for a med check

## 2014-04-19 NOTE — Telephone Encounter (Signed)
She needs TSH (dx hypothyroidism--it was checked 2 months ago, but rather than have her come in for another visit next month, will check now, and again in 6 months at her yearly exam). She also needs lipids and LFT's--dx mixed hyperlipidemia. I would like to have her schedule an OV, not simply send her lab results even if normal.  She hasn't been seen since being started on thyroid medication, just lab visits (would like vitals checked, including weight).

## 2014-04-25 ENCOUNTER — Encounter: Payer: Medicare Other | Admitting: Family Medicine

## 2014-04-28 ENCOUNTER — Ambulatory Visit (INDEPENDENT_AMBULATORY_CARE_PROVIDER_SITE_OTHER): Payer: Medicare Other | Admitting: Family Medicine

## 2014-04-28 ENCOUNTER — Encounter: Payer: Self-pay | Admitting: Family Medicine

## 2014-04-28 VITALS — BP 132/78 | HR 80 | Ht 61.0 in | Wt 154.0 lb

## 2014-04-28 DIAGNOSIS — E782 Mixed hyperlipidemia: Secondary | ICD-10-CM | POA: Diagnosis not present

## 2014-04-28 DIAGNOSIS — E039 Hypothyroidism, unspecified: Secondary | ICD-10-CM | POA: Diagnosis not present

## 2014-04-28 MED ORDER — FENOFIBRATE 54 MG PO TABS: 54.0000 mg | ORAL_TABLET | Freq: Every day | ORAL | Status: DC

## 2014-04-28 NOTE — Progress Notes (Signed)
Chief Complaint  Patient presents with  . Hyperlipidemia    nonfasting med check, labs already done.    Hypothyroidism:  She was started on Synthroid in September.  She never really had any symptoms related to her thyroid.  She is wondering how to take it on the days she takes her fosamax. She denies any changes in hair/skin/bowels/moods.  She has lost some weight.  Down 3 pounds since last visit, and wearing much heavier clothing today.  Mixed hyperlipidemia: She has been trying to follow a lowfat diet.  She only rarely eats fried foods.  She admits her downfall is pastas, breads (with butter).  She didn't bake or eat sweets through the holidays like she usually does. She reports compliance with her medications, and no side effects. She is taking 3 fish oil daily.  She has been trying to get out walking more (limited some by bad weather).  PMH, PSH, SH reviewed/updated  Outpatient Encounter Prescriptions as of 04/28/2014  Medication Sig Note  . alendronate (FOSAMAX) 70 MG tablet TAKE 1 TABLET BY MOUTH EVERY 7 DAYS WITH A FULL GLASS OF WATER. TAKE ON AN EMPTY STOMACH   . allopurinol (ZYLOPRIM) 100 MG tablet TAKE 1 TABLET (100 MG TOTAL) BY MOUTH DAILY.   . Calcium Carbonate-Vit D-Min (CALCIUM 1200) 1200-1000 MG-UNIT CHEW Chew 1 tablet by mouth daily.   . Cholecalciferol (VITAMIN D) 1000 UNITS capsule Take 1,000 Units by mouth daily.   . fenofibrate 54 MG tablet Take 1 tablet (54 mg total) by mouth daily.   . furosemide (LASIX) 20 MG tablet Take 1 tablet (20 mg total) by mouth daily.   . Omega-3 Fatty Acids (FISH OIL) 1200 MG CAPS Take 2 capsules by mouth daily. 04/28/2014: Taking 3 daily now  . omeprazole (PRILOSEC) 20 MG capsule Take 20 mg by mouth daily.   . potassium chloride SA (K-DUR,KLOR-CON) 20 MEQ tablet Take 1 tablet (20 mEq total) by mouth daily.   . rosuvastatin (CRESTOR) 20 MG tablet TAKE 1 TABLET (20 MG TOTAL) BY MOUTH DAILY.   . SYNTHROID 50 MCG tablet Take 1 tablet (50 mcg total)  by mouth daily before breakfast.   . timolol (BETIMOL) 0.5 % ophthalmic solution Place 1 drop into both eyes every morning.   . Travoprost, BAK Free, (TRAVATAN) 0.004 % SOLN ophthalmic solution Place 1 drop into both eyes at bedtime.   . [DISCONTINUED] fenofibrate 54 MG tablet Take 1 tablet (54 mg total) by mouth daily.   . [DISCONTINUED] alendronate (FOSAMAX) 70 MG tablet TAKE 1 TABLET BY MOUTH EVERY 7 DAYS WITH A FULL GLASS OF WATER. TAKE ON AN EMPTY STOMACH    Allergies  Allergen Reactions  . Penicillins Rash   ROS:  Denies headaches, dizziness, muscle aches, hair/skin/bowel/mood changes.  No URI symptoms, chest pain, shortness of breath. No bleeding, bruising, edema, depression, anxiety or other concerns.  PHYSICAL EXAM: BP 132/78 mmHg  Pulse 80  Ht 5\' 1"  (1.549 m)  Wt 154 lb (69.854 kg)  BMI 29.11 kg/m2 (weight with shoes) Well developed, pleasant female in no distress Neck: no lymphadenopathy, thyromegaly or mass Heart: regular rate and rhythm without murmur Lungs: clear bilaterally Abdomen: soft, nontender, no organomegaly or mass Extremities: no edema Psych: normal mood, affect, hygiene and grooming   Lab Results  Component Value Date   TSH 3.770 04/19/2014   Lab Results  Component Value Date   CHOL 143 04/19/2014   HDL 34* 04/19/2014   LDLCALC 71 04/19/2014   TRIG 191*  04/19/2014   CHOLHDL 4.2 04/19/2014   Lab Results  Component Value Date   ALT 25 04/19/2014   AST 21 04/19/2014   ALKPHOS 28* 04/19/2014   BILITOT 0.7 04/19/2014   ASSESSMENT/PLAN:  Mixed hyperlipidemia - TG remains above goal, but now <200. Reviewed lowfat, low cholesterol diet.  continue current rx meds, 3 fish oil daily.  recheck in July as scheduled - Plan: fenofibrate 54 MG tablet  Hypothyroidism, unspecified hypothyroidism type - adequately replaced on current dose.  Counseled re: diet, exercise, weight loss. Counseled re: proper way to take meds States she only got #30 of Crestor,  per computer was rx'd #90. She will check, and let us know if/when refills are needed.  Follow up in July with fasting labs prior.

## 2014-04-28 NOTE — Patient Instructions (Signed)
Continue your lowfat, low cholesterol diet.   Try and continue to walk at least 30 minutes daily (150-180 minutes of aerobic activity each week).  Continue your current medications. Let us know when you need refills (you should need thyroid medication and Crestor prior to your next visit).

## 2014-07-08 ENCOUNTER — Other Ambulatory Visit: Payer: Self-pay | Admitting: Family Medicine

## 2014-07-18 DIAGNOSIS — H4011X1 Primary open-angle glaucoma, mild stage: Secondary | ICD-10-CM | POA: Diagnosis not present

## 2014-08-02 ENCOUNTER — Other Ambulatory Visit: Payer: Self-pay | Admitting: Family Medicine

## 2014-10-06 ENCOUNTER — Other Ambulatory Visit: Payer: Medicare Other

## 2014-10-06 DIAGNOSIS — Z5181 Encounter for therapeutic drug level monitoring: Secondary | ICD-10-CM

## 2014-10-06 DIAGNOSIS — M8580 Other specified disorders of bone density and structure, unspecified site: Secondary | ICD-10-CM

## 2014-10-06 DIAGNOSIS — R7301 Impaired fasting glucose: Secondary | ICD-10-CM | POA: Diagnosis not present

## 2014-10-06 DIAGNOSIS — E039 Hypothyroidism, unspecified: Secondary | ICD-10-CM

## 2014-10-06 DIAGNOSIS — I10 Essential (primary) hypertension: Secondary | ICD-10-CM

## 2014-10-06 DIAGNOSIS — E782 Mixed hyperlipidemia: Secondary | ICD-10-CM | POA: Diagnosis not present

## 2014-10-06 DIAGNOSIS — M109 Gout, unspecified: Secondary | ICD-10-CM | POA: Diagnosis not present

## 2014-10-06 LAB — CBC WITH DIFFERENTIAL/PLATELET
BASOS PCT: 1 % (ref 0–1)
Basophils Absolute: 0.1 10*3/uL (ref 0.0–0.1)
EOS ABS: 0.2 10*3/uL (ref 0.0–0.7)
Eosinophils Relative: 4 % (ref 0–5)
HEMATOCRIT: 42.1 % (ref 36.0–46.0)
Hemoglobin: 14 g/dL (ref 12.0–15.0)
Lymphocytes Relative: 36 % (ref 12–46)
Lymphs Abs: 1.9 10*3/uL (ref 0.7–4.0)
MCH: 30.2 pg (ref 26.0–34.0)
MCHC: 33.3 g/dL (ref 30.0–36.0)
MCV: 90.9 fL (ref 78.0–100.0)
MONO ABS: 0.5 10*3/uL (ref 0.1–1.0)
MONOS PCT: 9 % (ref 3–12)
MPV: 10.1 fL (ref 8.6–12.4)
Neutro Abs: 2.7 10*3/uL (ref 1.7–7.7)
Neutrophils Relative %: 50 % (ref 43–77)
Platelets: 205 10*3/uL (ref 150–400)
RBC: 4.63 MIL/uL (ref 3.87–5.11)
RDW: 14 % (ref 11.5–15.5)
WBC: 5.4 10*3/uL (ref 4.0–10.5)

## 2014-10-06 LAB — LIPID PANEL
Cholesterol: 145 mg/dL (ref 0–200)
HDL: 35 mg/dL — ABNORMAL LOW (ref >=46)
LDL Cholesterol: 71 mg/dL (ref 0–99)
TRIGLYCERIDES: 193 mg/dL — AB (ref <150)
Total CHOL/HDL Ratio: 4.1 Ratio
VLDL: 39 mg/dL (ref 0–40)

## 2014-10-06 LAB — COMPREHENSIVE METABOLIC PANEL
ALT: 29 U/L (ref 0–35)
AST: 25 U/L (ref 0–37)
Albumin: 4.2 g/dL (ref 3.5–5.2)
Alkaline Phosphatase: 31 U/L — ABNORMAL LOW (ref 39–117)
BILIRUBIN TOTAL: 0.5 mg/dL (ref 0.2–1.2)
BUN: 17 mg/dL (ref 6–23)
CO2: 26 meq/L (ref 19–32)
CREATININE: 0.83 mg/dL (ref 0.50–1.10)
Calcium: 9.6 mg/dL (ref 8.4–10.5)
Chloride: 101 mEq/L (ref 96–112)
Glucose, Bld: 104 mg/dL — ABNORMAL HIGH (ref 70–99)
Potassium: 3.8 mEq/L (ref 3.5–5.3)
SODIUM: 137 meq/L (ref 135–145)
TOTAL PROTEIN: 6.5 g/dL (ref 6.0–8.3)

## 2014-10-06 LAB — HEMOGLOBIN A1C
Hgb A1c MFr Bld: 5.8 % — ABNORMAL HIGH (ref <5.7)
MEAN PLASMA GLUCOSE: 120 mg/dL — AB (ref <117)

## 2014-10-06 LAB — URIC ACID: Uric Acid, Serum: 5.9 mg/dL (ref 2.4–7.0)

## 2014-10-06 LAB — TSH: TSH: 3.437 u[IU]/mL (ref 0.350–4.500)

## 2014-10-10 ENCOUNTER — Ambulatory Visit (INDEPENDENT_AMBULATORY_CARE_PROVIDER_SITE_OTHER): Payer: Medicare Other | Admitting: Family Medicine

## 2014-10-10 ENCOUNTER — Encounter: Payer: Self-pay | Admitting: Family Medicine

## 2014-10-10 VITALS — BP 136/72 | HR 80 | Ht 60.75 in | Wt 153.0 lb

## 2014-10-10 DIAGNOSIS — K219 Gastro-esophageal reflux disease without esophagitis: Secondary | ICD-10-CM | POA: Diagnosis not present

## 2014-10-10 DIAGNOSIS — E782 Mixed hyperlipidemia: Secondary | ICD-10-CM | POA: Diagnosis not present

## 2014-10-10 DIAGNOSIS — H8102 Meniere's disease, left ear: Secondary | ICD-10-CM | POA: Diagnosis not present

## 2014-10-10 DIAGNOSIS — L821 Other seborrheic keratosis: Secondary | ICD-10-CM | POA: Diagnosis not present

## 2014-10-10 DIAGNOSIS — I10 Essential (primary) hypertension: Secondary | ICD-10-CM

## 2014-10-10 DIAGNOSIS — Z0000 Encounter for general adult medical examination without abnormal findings: Secondary | ICD-10-CM | POA: Diagnosis not present

## 2014-10-10 DIAGNOSIS — E039 Hypothyroidism, unspecified: Secondary | ICD-10-CM

## 2014-10-10 DIAGNOSIS — Z01419 Encounter for gynecological examination (general) (routine) without abnormal findings: Secondary | ICD-10-CM

## 2014-10-10 DIAGNOSIS — R7301 Impaired fasting glucose: Secondary | ICD-10-CM

## 2014-10-10 DIAGNOSIS — M1A0710 Idiopathic chronic gout, right ankle and foot, without tophus (tophi): Secondary | ICD-10-CM | POA: Diagnosis not present

## 2014-10-10 DIAGNOSIS — L570 Actinic keratosis: Secondary | ICD-10-CM

## 2014-10-10 DIAGNOSIS — M8580 Other specified disorders of bone density and structure, unspecified site: Secondary | ICD-10-CM

## 2014-10-10 LAB — HEMOCCULT GUIAC POC 1CARD (OFFICE): Fecal Occult Blood, POC: NEGATIVE

## 2014-10-10 MED ORDER — ALLOPURINOL 100 MG PO TABS: ORAL_TABLET | ORAL | Status: DC

## 2014-10-10 MED ORDER — FENOFIBRATE 54 MG PO TABS: 54.0000 mg | ORAL_TABLET | Freq: Every day | ORAL | Status: DC

## 2014-10-10 MED ORDER — POTASSIUM CHLORIDE CRYS ER 20 MEQ PO TBCR
20.0000 meq | EXTENDED_RELEASE_TABLET | Freq: Every day | ORAL | Status: DC
Start: 2014-10-10 — End: 2015-11-08

## 2014-10-10 MED ORDER — SYNTHROID 50 MCG PO TABS: 50.0000 ug | ORAL_TABLET | Freq: Every day | ORAL | Status: DC

## 2014-10-10 MED ORDER — FUROSEMIDE 20 MG PO TABS: 20.0000 mg | ORAL_TABLET | Freq: Every day | ORAL | Status: DC

## 2014-10-10 MED ORDER — ROSUVASTATIN CALCIUM 20 MG PO TABS: 20.0000 mg | ORAL_TABLET | Freq: Every day | ORAL | Status: DC

## 2014-10-10 MED ORDER — ALENDRONATE SODIUM 70 MG PO TABS: ORAL_TABLET | ORAL | Status: DC

## 2014-10-10 NOTE — Progress Notes (Signed)
Chief Complaint  Patient presents with  . Med check plus    nonfasting med check plus. No concerns.    Kaitlyn Abbott is a 74 y.o. female who presents for annual wellness visit and follow-up on chronic medical conditions.  She has the following concerns:  She has some rough spots on her right hand, and a few on her back.  She is interested in seeing a dermatologist, asking for recommendations.  Hypothyroidism: She is compliant with taking her Synthroid on empty stomach, separate from other medications. She never really had any symptoms related to her thyroid. She denies any changes in hair/skin/bowels/moods.  Mixed hyperlipidemia: She has been trying to follow a lowfat diet. She only rarely eats fried foods. She admits her downfall is pastas, breads (with butter), but she cut back on this some. She reports compliance with her medications, and no side effects. She is taking 3 fish oil daily.  Meniere's disease--hasn't had any vertigo, just decreased hearing in the left ear (chronic, unchanged). She hasn't seen ENT since moving here. She takes her lasix and potassium without side effect, muscle cramps. She periodically checks her BP's elsewhere, and they run in the low 120's/70's.   Gout--no flare in a few years. No side effects to allopurinol.  GERD: She is taking the prilosec now just as needed, once a week or less.  Denies any cough with eating, which had been her manifestation of reflux (not heartburn). Denies dysphagia.   Immunization History  Administered Date(s) Administered  . Influenza Split 11/29/2010, 01/14/2012  . Influenza, High Dose Seasonal PF 01/26/2013, 12/30/2013  . Pneumococcal Conjugate-13 10/06/2013  . Pneumococcal Polysaccharide-23 01/24/2010  . Tdap 03/23/2012   Never had Zostavax (but had h/o shingles in 2000-2001); previously given written rx to get vaccine from pharmacy, but hasn't gotten yet Last Pap smear: 09/2012, normal, with no high risk HPV Last  mammogram:09/2013, plans to schedule Last colonoscopy: 2009. Denies any h/o polyps  Last DEXA:  10/15/13 -1.9 for L femoral neck; on alendronate since 2013. Dentist: regularly, twice yearly Ophtho: every 6 months Exercise: walks 3-4x/week (or less) for 2-2.5 miles, and she started using hand weights, but does sporadically.   Other doctors caring for patient include: Ophtho: Dr. Martha Clan at [facility] Dentist: Dr. Lanice Schwab Ortho: Dr. Thurston Hole GI: None locally, seen in [place] (will use Dr. Ewing Schlein when due again)   Depression screen:  See epic questionnaire.  Negative screen ADL screen:  See epic questionnaire.  Notable for hearing loss in L ear (related to meniere's). Functional screen is otherwise normal. Fall screen is negative.  End of Life Discussion:  Patient has a living will and medical power of attorney  Past Medical History  Diagnosis Date  . Mixed hyperlipidemia   . Gout attack 2011  . Meniere disease   . Hypertension   . Low tension glaucoma     [facility]  . Vitamin D deficiency   . Osteopenia     Past Surgical History  Procedure Laterality Date  . Ganglion cyst excision  bilateral, 20 years apart    bilateral wrists  . Rotator cuff repair  10/2010    Right (Dr. Thurston Hole)    History   Social History  . Marital Status: Married    Spouse Name: N/A  . Number of Children: 2  . Years of Education: N/A   Occupational History  . retired Physiological scientist, Doctor, hospital)    Social History Main Topics  . Smoking status:  Never Smoker   . Smokeless tobacco: Never Used  . Alcohol Use: Yes     Comment: 3 drinks a month and occasion glass of wine.  . Drug Use: No  . Sexual Activity:    Partners: Male   Other Topics Concern  . Not on file   Social History Narrative   Lives with husband.  Daughter lives in [place], son moved from [place] to [place], [place].  No pets.  Daughter is Ship broker.  Babysits her granddaughters    Family History  Problem  Relation Age of Onset  . Diabetes Mother   . Hyperlipidemia Mother   . Hypertension Mother   . Arthritis Mother   . Hypothyroidism Mother   . Cancer Father     kidney CA, and bone CA  . Anxiety disorder Sister   . Hyperlipidemia Sister   . Hypertension Sister   . Arthritis Maternal Grandmother   . Heart disease Maternal Grandmother   . Diabetes Maternal Grandfather   . Heart disease Maternal Grandfather   . Stroke Maternal Grandfather   . Alcohol abuse Sister     Outpatient Encounter Prescriptions as of 10/10/2014  Medication Sig Note  . alendronate (FOSAMAX) 70 MG tablet TAKE 1 TABLET BY MOUTH EVERY 7 DAYS WITH A FULL GLASS OF WATER. TAKE ON AN EMPTY STOMACH   . allopurinol (ZYLOPRIM) 100 MG tablet TAKE 1 TABLET (100 MG TOTAL) BY MOUTH DAILY.   . Calcium-Magnesium-Vitamin D (CITRACAL SLOW RELEASE PO) Take 1 tablet by mouth daily. 10/10/2014: 1200 calcium and 1000 vitamin d  . fenofibrate 54 MG tablet Take 1 tablet (54 mg total) by mouth daily.   . furosemide (LASIX) 20 MG tablet Take 1 tablet (20 mg total) by mouth daily.   . Omega-3 Fatty Acids (FISH OIL) 1200 MG CAPS Take 2 capsules by mouth daily. 04/28/2014: Taking 3 daily now  . omeprazole (PRILOSEC) 20 MG capsule Take 20 mg by mouth daily. 10/10/2014: Only takes prn  . potassium chloride SA (K-DUR,KLOR-CON) 20 MEQ tablet Take 1 tablet (20 mEq total) by mouth daily.   . rosuvastatin (CRESTOR) 20 MG tablet Take 1 tablet (20 mg total) by mouth daily.   Marland Kitchen SYNTHROID 50 MCG tablet Take 1 tablet (50 mcg total) by mouth daily before breakfast.   . timolol (BETIMOL) 0.5 % ophthalmic solution Place 1 drop into both eyes every morning.   . Travoprost, BAK Free, (TRAVATAN) 0.004 % SOLN ophthalmic solution Place 1 drop into both eyes at bedtime.   . [DISCONTINUED] alendronate (FOSAMAX) 70 MG tablet TAKE 1 TABLET BY MOUTH EVERY 7 DAYS WITH A FULL GLASS OF WATER. TAKE ON AN EMPTY STOMACH   . [DISCONTINUED] allopurinol (ZYLOPRIM) 100 MG tablet  TAKE 1 TABLET (100 MG TOTAL) BY MOUTH DAILY.   . [DISCONTINUED] Calcium Carbonate-Vit D-Min (CALCIUM 1200) 1200-1000 MG-UNIT CHEW Chew 1 tablet by mouth daily.   . [DISCONTINUED] Cholecalciferol (VITAMIN D) 1000 UNITS capsule Take 1,000 Units by mouth daily.   . [DISCONTINUED] CRESTOR 20 MG tablet TAKE 1 TABLET BY MOUTH EVERY DAY   . [DISCONTINUED] fenofibrate 54 MG tablet Take 1 tablet (54 mg total) by mouth daily.   . [DISCONTINUED] furosemide (LASIX) 20 MG tablet Take 1 tablet (20 mg total) by mouth daily.   . [DISCONTINUED] potassium chloride SA (K-DUR,KLOR-CON) 20 MEQ tablet Take 1 tablet (20 mEq total) by mouth daily.   . [DISCONTINUED] SYNTHROID 50 MCG tablet TAKE 1 TABLET DAILY BEFORE BREAKFAST    Facility-Administered Encounter Medications  as of 10/10/2014  Medication  . influenza  inactive virus vaccine (FLUZONE/FLUARIX) injection 0.5 mL    Allergies  Allergen Reactions  . Penicillins Rash    ROS: The patient denies anorexia, weight changes, fever, headaches (no longer getting mild headache after taking her fosamax); vision changes, decreased hearing (diminished on left, chronic/unchanged), ear pain, sore throat, breast concerns, chest pain, palpitations, dizziness, syncope, dyspnea on exertion, cough, swelling, nausea, vomiting, diarrhea, constipation, abdominal pain, melena, hematochezia, indigestion/heartburn, hematuria, incontinence, dysuria, vaginal bleeding, discharge, or odor, but has slight external vaginal itch (chronic, unchanged). denies genital lesions, numbness, tingling, weakness, tremor, depression, anxiety, abnormal bleeding/bruising, or enlarged lymph nodes.  +arthritis in her hands usually just first thing in the morning has some stiffness (doesn't require meds, does better after running them in hot water); no other joint pains. No gout flares   PHYSICAL EXAM:  BP 136/72 mmHg  Pulse 80  Ht 5' 0.75" (1.543 m)  Wt 153 lb (69.4 kg)  BMI 29.15 kg/m2  General  Appearance:  Alert, cooperative, no distress, appears stated age   Head:  Normocephalic, without obvious abnormality, atraumatic   Eyes:  PERRL, conjunctiva/corneas clear, EOM's intact, fundi  benign   Ears:  Normal TM's and external ear canals   Nose:  Nares normal, mucosa normal, no drainage or sinus tenderness   Throat:  Lips, mucosa, and tongue normal; teeth and gums normal   Neck:  Supple, no lymphadenopathy; thyroid: no enlargement/tenderness/nodules; no carotid  bruit or JVD   Back:  Spine nontender, no curvature, ROM normal, no CVA tenderness   Lungs:  Clear to auscultation bilaterally without wheezes, rales or ronchi; respirations unlabored   Chest Wall:  No tenderness or deformity   Heart:  Regular rate and rhythm, S1 and S2 normal, no murmur, rub  or gallop   Breast Exam:  No tenderness, masses, or nipple discharge or inversion. No axillary lymphadenopathy   Abdomen:  Soft, non-tender, nondistended, normoactive bowel sounds,  no masses, no hepatosplenomegaly   Genitalia:  Normal external genitalia without lesions. Mild atrophic changes of external genitalia/labia majora, slightly pink/inflamed. BUS and vagina normal; no cervical motion tenderness. No abnormal vaginal discharge. Uterus and adnexa not enlarged, nontender, no masses. Pap not performed   Rectal:  Normal tone, no masses or tenderness; guaiac negative stool   Extremities:  No clubbing, cyanosis or edema.   Pulses:  2+ and symmetric all extremities   Skin:  Skin color, texture, turgor normal, no rashes. Rough white patch consistent with actinic keratosis on right hand; brown lesion consistent with seborrheic keratosis on left abdomen/side  Lymph nodes:  Cervical, supraclavicular, and axillary nodes normal   Neurologic:  CNII-XII intact, normal strength, sensation and gait; reflexes 2+ and symmetric throughout    Psych: Normal mood, affect, hygiene and  grooming        Lab Results  Component Value Date   WBC 5.4 10/06/2014   HGB 14.0 10/06/2014   HCT 42.1 10/06/2014   MCV 90.9 10/06/2014   PLT 205 10/06/2014     Chemistry      Component Value Date/Time   NA 137 10/06/2014 0001   K 3.8 10/06/2014 0001   CL 101 10/06/2014 0001   CO2 26 10/06/2014 0001   BUN 17 10/06/2014 0001   CREATININE 0.83 10/06/2014 0001      Component Value Date/Time   CALCIUM 9.6 10/06/2014 0001   ALKPHOS 31* 10/06/2014 0001   AST 25 10/06/2014 0001   ALT 29 10/06/2014  0001   BILITOT 0.5 10/06/2014 0001     Fasting glucose 104  Lab Results  Component Value Date   HGBA1C 5.8* 10/06/2014    Lab Results  Component Value Date   CHOL 145 10/06/2014   HDL 35* 10/06/2014   LDLCALC 71 10/06/2014   TRIG 193* 10/06/2014   CHOLHDL 4.1 10/06/2014   Lab Results  Component Value Date   TSH 3.437 10/06/2014   Uric acid 5.9  ASSESSMENT/PLAN:  Medicare annual wellness visit, subsequent  Idiopathic chronic gout of right foot without tophus - well controlled without any flares and uric acid level at goal on low dose allopurinol - Plan: allopurinol (ZYLOPRIM) 100 MG tablet  Mixed hyperlipidemia - TG remains above goal, but remains <200. Reviewed lowfat, low cholesterol diet.  continue current rx meds, 3 fish oil daily.  - Plan: rosuvastatin (CRESTOR) 20 MG tablet, fenofibrate 54 MG tablet  Meniere disease, left - stable on current regimen without any vertigo, unchanged hearing loss - Plan: furosemide (LASIX) 20 MG tablet, potassium chloride SA (K-DUR,KLOR-CON) 20 MEQ tablet  Osteopenia - Osteopenia (with abnormal FRAX score in 2013)--on fosamax since 2013.  Continue for total of 5 years.  DEXA due again next year. - Plan: alendronate (FOSAMAX) 70 MG tablet  Essential hypertension, benign - ? if she has hypertension.  She has reportedly had some elevated BP's when not taking Lasix.  BPs are all normal (on Lasix for Meniere's)  Gastroesophageal  reflux disease without esophagitis - improved, now only on prn medications.  continue proper diet  Hypothyroidism, unspecified hypothyroidism type - adequately replaced on current regimen - Plan: SYNTHROID 50 MCG tablet  Impaired fasting glucose - reviewed diet (decrease carbs); encouraged daily exercise and weight loss  Actinic keratosis - cryotherapy recommended--can return here, or see derm if she prefers.  names given.  Seborrheic keratosis - non-inflamed, reassured   Discussed monthly self breast exams and yearly mammograms; at least 30 minutes of aerobic activity at least 5 days/week, weight-bearing exercise at least 2x/wk; proper sunscreen use reviewed; healthy diet, including goals of calcium and vitamin D intake and alcohol recommendations (less than or equal to 1 drink/day) reviewed; regular seatbelt use; changing batteries in smoke detectors. Immunization recommendations discussed; yearly high dose flu shots are recommended. Shingles vaccine encouraged. She will plan to get after returning from [place] trip to [place] (at pharmacy); advised of need to separate from the flu shot by a month.  Colonoscopy recommendations reviewed--UTD.   Medicare Attestation I have personally reviewed: The patient's medical and social history Their use of alcohol, tobacco or illicit drugs Their current medications and supplements The patient's functional ability including ADLs,fall risks, home safety risks, cognitive, and hearing and visual impairment Diet and physical activities Evidence for depression or mood disorders  The patient's weight, height, BMI, and visual acuity have been recorded in the chart.  I have made referrals, counseling, and provided education to the patient based on review of the above and I have provided the patient with a written personalized care plan for preventive services.     Kaitlyn Abbott A, MD   10/10/2014

## 2014-10-10 NOTE — Patient Instructions (Addendum)
  HEALTH MAINTENANCE RECOMMENDATIONS:  It is recommended that you get at least 30 minutes of aerobic exercise at least 5 days/week (for weight loss, you may need as much as 60-90 minutes). This can be any activity that gets your heart rate up. This can be divided in 10-15 minute intervals if needed, but try and build up your endurance at least once a week.  Weight bearing exercise is also recommended twice weekly.  Eat a healthy diet with lots of vegetables, fruits and fiber.  "Colorful" foods have a lot of vitamins (ie green vegetables, tomatoes, red peppers, etc).  Limit sweet tea, regular sodas and alcoholic beverages, all of which has a lot of calories and sugar.  Up to 1 alcoholic drink daily may be beneficial for women (unless trying to lose weight, watch sugars).  Drink a lot of water.  Calcium recommendations are 1200-1500 mg daily (1500 mg for postmenopausal women or women without ovaries), and vitamin D 1000 IU daily.  This should be obtained from diet and/or supplements (vitamins), and calcium should not be taken all at once, but in divided doses.  Monthly self breast exams and yearly mammograms for women over the age of 73 is recommended.  Sunscreen of at least SPF 30 should be used on all sun-exposed parts of the skin when outside between the hours of 10 am and 4 pm (not just when at beach or pool, but even with exercise, golf, tennis, and yard work!)  Use a sunscreen that says "broad spectrum" so it covers both UVA and UVB rays, and make sure to reapply every 1-2 hours.  Remember to change the batteries in your smoke detectors when changing your clock times in the spring and fall.  Use your seat belt every time you are in a car, and please drive safely and not be distracted with cell phones and texting while driving.  Dr. Campbell StallHope Abbott, Dr. Elmon ElseKaren Abbott, or anyone in their office are good dermatologists (on [address], near [address]).   Ms. Kaitlyn Abbott , Thank you for taking time to come for  your Medicare Wellness Visit. I appreciate your ongoing commitment to your health goals. Please review the following plan we discussed and let me know if I can assist you in the future.   These are the goals we discussed: Goals    None      This is a list of the screening recommended for you and due dates:  Health Maintenance  Topic Date Due  . Shingles Vaccine  09/30/2001  . Flu Shot  10/31/2014  . Mammogram  10/23/2015  . Colon Cancer Screening  03/01/2018  . Tetanus Vaccine  03/23/2022  . DEXA scan (bone density measurement)  Completed  . Pneumonia vaccines  Completed   Shingles vaccine recommended as above.  Everything else is up to date

## 2014-11-16 ENCOUNTER — Other Ambulatory Visit: Payer: Self-pay

## 2014-11-16 DIAGNOSIS — Z1231 Encounter for screening mammogram for malignant neoplasm of breast: Secondary | ICD-10-CM

## 2014-11-22 ENCOUNTER — Other Ambulatory Visit: Payer: Self-pay | Admitting: Family Medicine

## 2014-11-24 ENCOUNTER — Ambulatory Visit
Admission: RE | Admit: 2014-11-24 | Discharge: 2014-11-24 | Disposition: A | Discharge status: Home / Self Care | Payer: Medicare Other | Source: Ambulatory Visit

## 2014-11-24 DIAGNOSIS — Z1231 Encounter for screening mammogram for malignant neoplasm of breast: Secondary | ICD-10-CM

## 2014-12-14 DIAGNOSIS — H4011X1 Primary open-angle glaucoma, mild stage: Secondary | ICD-10-CM | POA: Diagnosis not present

## 2014-12-21 ENCOUNTER — Ambulatory Visit (INDEPENDENT_AMBULATORY_CARE_PROVIDER_SITE_OTHER): Payer: Medicare Other | Admitting: Podiatry

## 2014-12-21 ENCOUNTER — Ambulatory Visit (INDEPENDENT_AMBULATORY_CARE_PROVIDER_SITE_OTHER): Payer: Medicare Other

## 2014-12-21 ENCOUNTER — Ambulatory Visit: Payer: Medicare Other

## 2014-12-21 VITALS — BP 147/78 | HR 67 | Resp 16 | Ht 62.0 in | Wt 140.0 lb

## 2014-12-21 DIAGNOSIS — M79673 Pain in unspecified foot: Secondary | ICD-10-CM

## 2014-12-21 DIAGNOSIS — M7661 Achilles tendinitis, right leg: Secondary | ICD-10-CM

## 2014-12-21 DIAGNOSIS — M7651 Patellar tendinitis, right knee: Secondary | ICD-10-CM

## 2014-12-21 DIAGNOSIS — M7652 Patellar tendinitis, left knee: Secondary | ICD-10-CM

## 2014-12-21 DIAGNOSIS — L600 Ingrowing nail: Secondary | ICD-10-CM

## 2014-12-21 MED ORDER — TRIAMCINOLONE ACETONIDE 10 MG/ML IJ SUSP
10.0000 mg | Freq: Once | INTRAMUSCULAR | Status: AC
  Administered 2014-12-21: 10 mg

## 2014-12-21 NOTE — Progress Notes (Signed)
   Subjective:    Patient ID: Kaitlyn Abbott, female    DOB: 09-27-1940, 74 y.o.   MRN: [id number]  HPI Patient presents with toe pain in their left foot, great toe-lateral side. Pt stated, "oozing from nail-mostly clear looking". This has been going on for the past 2-3 months. Pain started 2 weeks ago.  Patient also presents with bilateral foot pain, back of heel. Pt stated, "worse in the morning"; x1 month.  Patient also would like a nail check done on both feet.    Review of Systems  HENT: Positive for tinnitus.   All other systems reviewed and are negative.      Objective:   Physical Exam        Assessment & Plan:

## 2014-12-21 NOTE — Progress Notes (Signed)
Subjective:     Patient ID: Kaitlyn Abbott, female   DOB: 06/16/1940, 74 y.o.   MRN: [id number]  HPI patient states she went to [place] and developed pain in the back of the heel right over left after prolonged walking. She also has developed a very painful ingrown toenail left big toe lateral border that has been draining but is more irritated than it is red   Review of Systems  All other systems reviewed and are negative.      Objective:   Physical Exam  Constitutional: She is oriented to person, place, and time.  Cardiovascular: Intact distal pulses.   Musculoskeletal: Normal range of motion.  Neurological: She is oriented to person, place, and time.  Skin: Skin is warm.  Nursing note and vitals reviewed.  neurovascular status intact muscle strength adequate range of motion within normal limits with patient noted to have incurvated left hallux lateral border that's very tender when pressed and on the right lateral foot there is quite a bit of pain around the insertion of the Achilles tendon right side over left lateral tendon. Patient has good digital perfusion is well oriented 3 with no muscle strength loss     Assessment:     Achilles tendinitis right over left with inflammation of the lateral side an ingrown toenail deformity left hallux    Plan:     Reviewed both conditions and discussed careful injection of the right Achilles tendon explaining risk. Patient wants this done and at this time I did a careful lateral injection 3 mg dexamethasone Kenalog 5 mg Xylocaine and advised on physical therapy. I then recommended correction of the ingrown toenail she wants this fixed and I explained risk and she wants surgery. I infiltrated 60 mg Xylocaine Marcaine mixture remove the lateral border of the left hallux exposed matrix and applied phenol 3 applications 30 seconds followed by alcohol lavage and sterile dressings. Gave instructions on soaks and reappoint

## 2014-12-21 NOTE — Patient Instructions (Addendum)
Achilles Tendinitis  with Rehab Achilles tendinitis is a disorder of the Achilles tendon. The Achilles tendon connects the large calf muscles (Gastrocnemius and Soleus) to the heel bone (calcaneus). This tendon is sometimes called the heel cord. It is important for pushing-off and standing on your toes and is important for walking, running, or jumping. Tendinitis is often caused by overuse and repetitive microtrauma. SYMPTOMS  Pain, tenderness, swelling, warmth, and redness may occur over the Achilles tendon even at rest.  Pain with pushing off, or flexing or extending the ankle.  Pain that is worsened after or during activity. CAUSES  1. Overuse sometimes seen with rapid increase in exercise programs or in sports requiring running and jumping. 2. Poor physical conditioning (strength and flexibility or endurance). 3. Running sports, especially training running down hills. 4. Inadequate warm-up before practice or play or failure to stretch before participation. 5. Injury to the tendon. PREVENTION   Warm up and stretch before practice or competition.  Allow time for adequate rest and recovery between practices and competition.  Keep up conditioning.  Keep up ankle and leg flexibility.  Improve or keep muscle strength and endurance.  Improve cardiovascular fitness.  Use proper technique.  Use proper equipment (shoes, skates).  To help prevent recurrence, taping, protective strapping, or an adhesive bandage may be recommended for several weeks after healing is complete. PROGNOSIS   Recovery may take weeks to several months to heal.  Longer recovery is expected if symptoms have been prolonged.  Recovery is usually quicker if the inflammation is due to a direct blow as compared with overuse or sudden strain. RELATED COMPLICATIONS   Healing time will be prolonged if the condition is not correctly treated. The injury must be given plenty of time to heal.  Symptoms can reoccur if  activity is resumed too soon.  Untreated, tendinitis may increase the risk of tendon rupture requiring additional time for recovery and possibly surgery. TREATMENT   The first treatment consists of rest anti-inflammatory medication, and ice to relieve the pain.  Stretching and strengthening exercises after resolution of pain will likely help reduce the risk of recurrence. Referral to a physical therapist or athletic trainer for further evaluation and treatment may be helpful.  A walking boot or cast may be recommended to rest the Achilles tendon. This can help break the cycle of inflammation and microtrauma.  Arch supports (orthotics) may be prescribed or recommended by your caregiver as an adjunct to therapy and rest.  Surgery to remove the inflamed tendon lining or degenerated tendon tissue is rarely necessary and has shown less than predictable results. MEDICATION   Nonsteroidal anti-inflammatory medications, such as aspirin and ibuprofen, may be used for pain and inflammation relief. Do not take within 7 days before surgery. Take these as directed by your caregiver. Contact your caregiver immediately if any bleeding, stomach upset, or signs of allergic reaction occur. Other minor pain relievers, such as acetaminophen, may also be used.  Pain relievers may be prescribed as necessary by your caregiver. Do not take prescription pain medication for longer than 4 to 7 days. Use only as directed and only as much as you need.  Cortisone injections are rarely indicated. Cortisone injections may weaken tendons and predispose to rupture. It is better to give the condition more time to heal than to use them. HEAT AND COLD  Cold is used to relieve pain and reduce inflammation for acute and chronic Achilles tendinitis. Cold should be applied for 10 to 15 minutes  every 2 to 3 hours for inflammation and pain and immediately after any activity that aggravates your symptoms. Use ice packs or an ice  massage.  Heat may be used before performing stretching and strengthening activities prescribed by your caregiver. Use a heat pack or a warm soak. SEEK MEDICAL CARE IF:  Symptoms get worse or do not improve in 2 weeks despite treatment.  New, unexplained symptoms develop. Drugs used in treatment may produce side effects.  EXERCISES:  RANGE OF MOTION (ROM) AND STRETCHING EXERCISES - Achilles Tendinitis  These exercises may help you when beginning to rehabilitate your injury. Your symptoms may resolve with or without further involvement from your physician, physical therapist or athletic trainer. While completing these exercises, remember:   Restoring tissue flexibility helps normal motion to return to the joints. This allows healthier, less painful movement and activity.  An effective stretch should be held for at least 30 seconds.  A stretch should never be painful. You should only feel a gentle lengthening or release in the stretched tissue.  STRETCH  Gastroc, Standing   Place hands on wall.  Extend right / left leg, keeping the front knee somewhat bent.  Slightly point your toes inward on your back foot.  Keeping your right / left heel on the floor and your knee straight, shift your weight toward the wall, not allowing your back to arch.  You should feel a gentle stretch in the right / left calf. Hold this position for 10 seconds. Repeat 3 times. Complete this stretch 2 times per day.  STRETCH  Soleus, Standing   Place hands on wall.  Extend right / left leg, keeping the other knee somewhat bent.  Slightly point your toes inward on your back foot.  Keep your right / left heel on the floor, bend your back knee, and slightly shift your weight over the back leg so that you feel a gentle stretch deep in your back calf.  Hold this position for 10 seconds. Repeat 3 times. Complete this stretch 2 times per day.  STRETCH  Gastrocsoleus, Standing  Note: This exercise can place  a lot of stress on your foot and ankle. Please complete this exercise only if specifically instructed by your caregiver.   Place the ball of your right / left foot on a step, keeping your other foot firmly on the same step.  Hold on to the wall or a rail for balance.  Slowly lift your other foot, allowing your body weight to press your heel down over the edge of the step.  You should feel a stretch in your right / left calf.  Hold this position for 10 seconds.  Repeat this exercise with a slight bend in your knee. Repeat 3 times. Complete this stretch 2 times per day.   STRENGTHENING EXERCISES - Achilles Tendinitis These exercises may help you when beginning to rehabilitate your injury. They may resolve your symptoms with or without further involvement from your physician, physical therapist or athletic trainer. While completing these exercises, remember:   Muscles can gain both the endurance and the strength needed for everyday activities through controlled exercises.  Complete these exercises as instructed by your physician, physical therapist or athletic trainer. Progress the resistance and repetitions only as guided.  You may experience muscle soreness or fatigue, but the pain or discomfort you are trying to eliminate should never worsen during these exercises. If this pain does worsen, stop and make certain you are following the directions exactly. If   the pain is still present after adjustments, discontinue the exercise until you can discuss the trouble with your clinician.  STRENGTH - Plantar-flexors   Sit with your right / left leg extended. Holding onto both ends of a rubber exercise band/tubing, loop it around the ball of your foot. Keep a slight tension in the band.  Slowly push your toes away from you, pointing them downward.  Hold this position for 10 seconds. Return slowly, controlling the tension in the band/tubing. Repeat 3 times. Complete this exercise 2 times per day.     STRENGTH - Plantar-flexors   Stand with your feet shoulder width apart. Steady yourself with a wall or table using as little support as needed.  Keeping your weight evenly spread over the width of your feet, rise up on your toes.*  Hold this position for 10 seconds. Repeat 3 times. Complete this exercise 2 times per day.  *If this is too easy, shift your weight toward your right / left leg until you feel challenged. Ultimately, you may be asked to do this exercise with your right / left foot only.  STRENGTH  Plantar-flexors, Eccentric  Note: This exercise can place a lot of stress on your foot and ankle. Please complete this exercise only if specifically instructed by your caregiver.   Place the balls of your feet on a step. With your hands, use only enough support from a wall or rail to keep your balance.  Keep your knees straight and rise up on your toes.  Slowly shift your weight entirely to your right / left toes and pick up your opposite foot. Gently and with controlled movement, lower your weight through your right / left foot so that your heel drops below the level of the step. You will feel a slight stretch in the back of your calf at the end position.  Use the healthy leg to help rise up onto the balls of both feet, then lower weight only on the right / left leg again. Build up to 15 repetitions. Then progress to 3 consecutive sets of 15 repetitions.*  After completing the above exercise, complete the same exercise with a slight knee bend (about 30 degrees). Again, build up to 15 repetitions. Then progress to 3 consecutive sets of 15 repetitions.* Perform this exercise 2 times per day.  *When you easily complete 3 sets of 15, your physician, physical therapist or athletic trainer may advise you to add resistance by wearing a backpack filled with additional weight.  STRENGTH - Plantar Flexors, Seated   Sit on a chair that allows your feet to rest flat on the ground. If  necessary, sit at the edge of the chair.  Keeping your toes firmly on the ground, lift your right / left heel as far as you can without increasing any discomfort in your ankle. Repeat 3 times. Complete this exercise 2 times a day.   ANTIBACTERIAL SOAP INSTRUCTIONS  THE DAY AFTER PROCEDURE  Please follow the instructions your doctor has marked.   Shower as usual. Before getting out, place a drop of antibacterial liquid soap (Dial) on a wet, clean washcloth.  Gently wipe washcloth over affected area.  Afterward, rinse the area with warm water.  Blot the area dry with a soft cloth and cover with antibiotic ointment (neosporin, polysporin, bacitracin) and band aid or gauze and tape  Place 3-4 drops of antibacterial liquid soap in a quart of warm tap water.  Submerge foot into water for 20 minutes.  If bandage was applied after your procedure, leave on to allow for easy lift off, then remove and continue with soak for the remaining time.  Next, blot area dry with a soft cloth and cover with a bandage.  Apply other medications as directed by your doctor, such as cortisporin otic solution (eardrops) or neosporin antibiotic ointment   Long Term Care Instructions-Post Nail Surgery  You have had your ingrown toenail and root treated with a chemical.  This chemical causes a burn that will drain and ooze like a blister.  This can drain for 6-8 weeks or longer.  It is important to keep this area clean, covered, and follow the soaking instructions dispensed at the time of your surgery.  This area will eventually dry and form a scab.  Once the scab forms you no longer need to soak or apply a dressing.  If at any time you experience an increase in pain, redness, swelling, or drainage, you should contact the office as soon as possible.

## 2014-12-22 ENCOUNTER — Telehealth: Payer: Self-pay | Admitting: *Deleted

## 2014-12-22 NOTE — Telephone Encounter (Signed)
Called patient at [phone] (Cell #) to check to see how they were feeling from their ingrown toenail procedure that was performed on Wednesday, December 21, 2014. Pt stated, "toe was painful last night, but has iced and taken aspirin with some relief".

## 2015-01-04 ENCOUNTER — Encounter: Payer: Self-pay | Admitting: Podiatry

## 2015-01-04 ENCOUNTER — Ambulatory Visit (INDEPENDENT_AMBULATORY_CARE_PROVIDER_SITE_OTHER): Payer: Medicare Other | Admitting: Podiatry

## 2015-01-04 VITALS — BP 135/79 | HR 63 | Resp 16

## 2015-01-04 DIAGNOSIS — M79673 Pain in unspecified foot: Secondary | ICD-10-CM

## 2015-01-04 DIAGNOSIS — M7661 Achilles tendinitis, right leg: Secondary | ICD-10-CM | POA: Diagnosis not present

## 2015-01-04 DIAGNOSIS — L600 Ingrowing nail: Secondary | ICD-10-CM | POA: Diagnosis not present

## 2015-01-04 NOTE — Progress Notes (Signed)
Subjective:     Patient ID: Tally Due, female   DOB: 05-05-1940, 74 y.o.   MRN: [id number]  HPI patient presents stating I am having diminished pain in both feet and the ingrown seems improved in the heel is improved quite a bit   Review of Systems     Objective:   Physical Exam Neurovascular status intact with inflammation plantar aspect right heel improved but still painful when pressed and nails site left it's healing well    Assessment:     Plantar fasciitis improved but still present right along with ingrown toenail left which is improving with treatment    Plan:     Advised on continue soaking on the left and for the right I advised on physical therapy supportive shoes and utilization of heel lifts. Patient be seen back as needed

## 2015-01-12 ENCOUNTER — Other Ambulatory Visit (INDEPENDENT_AMBULATORY_CARE_PROVIDER_SITE_OTHER): Payer: Medicare Other

## 2015-01-12 DIAGNOSIS — Z23 Encounter for immunization: Secondary | ICD-10-CM

## 2015-03-19 ENCOUNTER — Other Ambulatory Visit: Payer: Self-pay | Admitting: Family Medicine

## 2015-04-06 ENCOUNTER — Other Ambulatory Visit: Payer: Medicare Other

## 2015-04-06 DIAGNOSIS — R7301 Impaired fasting glucose: Secondary | ICD-10-CM

## 2015-04-06 DIAGNOSIS — E782 Mixed hyperlipidemia: Secondary | ICD-10-CM

## 2015-04-07 ENCOUNTER — Other Ambulatory Visit: Payer: Self-pay | Admitting: Family Medicine

## 2015-04-07 LAB — HEPATIC FUNCTION PANEL
ALBUMIN: 4.2 g/dL (ref 3.6–5.1)
ALK PHOS: 34 U/L (ref 33–130)
ALT: 36 U/L — AB (ref 6–29)
AST: 32 U/L (ref 10–35)
BILIRUBIN DIRECT: 0.1 mg/dL (ref <=0.2)
BILIRUBIN TOTAL: 0.7 mg/dL (ref 0.2–1.2)
Indirect Bilirubin: 0.6 mg/dL (ref 0.2–1.2)
Total Protein: 6.7 g/dL (ref 6.1–8.1)

## 2015-04-07 LAB — LIPID PANEL
CHOL/HDL RATIO: 4.5 ratio (ref <=5.0)
Cholesterol: 153 mg/dL (ref 125–200)
HDL: 34 mg/dL — AB (ref >=46)
LDL CALC: 80 mg/dL (ref <130)
TRIGLYCERIDES: 194 mg/dL — AB (ref <150)
VLDL: 39 mg/dL — ABNORMAL HIGH (ref <30)

## 2015-04-07 LAB — HEMOGLOBIN A1C
Hgb A1c MFr Bld: 5.9 % — ABNORMAL HIGH (ref <5.7)
Mean Plasma Glucose: 123 mg/dL — ABNORMAL HIGH (ref <117)

## 2015-04-07 LAB — GLUCOSE, RANDOM: GLUCOSE: 104 mg/dL — AB (ref 65–99)

## 2015-04-13 ENCOUNTER — Encounter: Payer: Self-pay | Admitting: Family Medicine

## 2015-04-13 ENCOUNTER — Ambulatory Visit (INDEPENDENT_AMBULATORY_CARE_PROVIDER_SITE_OTHER): Payer: Medicare Other | Admitting: Family Medicine

## 2015-04-13 VITALS — BP 158/80 | HR 68 | Ht 60.75 in | Wt 158.0 lb

## 2015-04-13 DIAGNOSIS — M109 Gout, unspecified: Secondary | ICD-10-CM

## 2015-04-13 DIAGNOSIS — H8102 Meniere's disease, left ear: Secondary | ICD-10-CM | POA: Diagnosis not present

## 2015-04-13 DIAGNOSIS — Z5181 Encounter for therapeutic drug level monitoring: Secondary | ICD-10-CM | POA: Diagnosis not present

## 2015-04-13 DIAGNOSIS — E039 Hypothyroidism, unspecified: Secondary | ICD-10-CM | POA: Diagnosis not present

## 2015-04-13 DIAGNOSIS — I10 Essential (primary) hypertension: Secondary | ICD-10-CM | POA: Diagnosis not present

## 2015-04-13 DIAGNOSIS — R7301 Impaired fasting glucose: Secondary | ICD-10-CM | POA: Diagnosis not present

## 2015-04-13 DIAGNOSIS — E782 Mixed hyperlipidemia: Secondary | ICD-10-CM

## 2015-04-13 MED ORDER — FENOFIBRATE 54 MG PO TABS: 54.0000 mg | ORAL_TABLET | Freq: Every day | ORAL | Status: DC

## 2015-04-13 NOTE — Progress Notes (Signed)
Chief Complaint  Patient presents with  . Hypertension    nonfasting med check, labs already.    Mixed hyperlipidemia: She has been trying to follow a lowfat diet.She has been traveling in the last couple of months, eating out more, less walking. Typical diet: She only rarely eats fried foods. She admits her downfall is pastas, breads (with butter), but she cut back on this some. She reports compliance with her medications, and no side effects. She is taking 3 fish oil daily.  Meniere's disease--she had one episode of vertigo [place] (eating bavarian food, high in salt)--had a small dizzy spell, better the next day.  She has decreased hearing in the left ear (chronic, unchanged). She hasn't seen ENT since moving here. She takes her lasix and potassium without side effect, muscle cramps. She periodically checks her BP's elsewhere, and they run in the low 120's/70's. Last check was 2-3 days ago, and it was 133/80.  She hasn't taken her lasix yet today.  Gout--no flare in a few years. No side effects to allopurinol.  GERD: She is taking the prilosec now just as needed, once a week or less. Denies any cough with eating, which had been her manifestation of reflux (not heartburn). Denies dysphagia.  Hypothyroidism: She is compliant with taking her Synthroid on empty stomach, separate from other medications. She never really had any symptoms related to her thyroid. She denies any changes in hair/skin/bowels/moods.  No true history of hypertension, but is on lasix just for the Meniere's disease.  PMH, PSH, SH reviewed  Outpatient Encounter Prescriptions as of 04/13/2015  Medication Sig Note  . alendronate (FOSAMAX) 70 MG tablet TAKE 1 TABLET BY MOUTH EVERY 7 DAYS WITH A FULL GLASS OF WATER. TAKE ON AN EMPTY STOMACH   . allopurinol (ZYLOPRIM) 100 MG tablet TAKE 1 TABLET (100 MG TOTAL) BY MOUTH DAILY.   . Calcium-Magnesium-Vitamin D (CITRACAL SLOW RELEASE PO) Take 1 tablet by mouth  daily. 10/10/2014: 1200 calcium and 1000 vitamin d  . fenofibrate 54 MG tablet Take 1 tablet (54 mg total) by mouth daily.   . furosemide (LASIX) 20 MG tablet Take 1 tablet (20 mg total) by mouth daily.   . Omega-3 Fatty Acids (FISH OIL) 1200 MG CAPS Take 2 capsules by mouth daily. 04/28/2014: Taking 3 daily now  . omeprazole (PRILOSEC) 20 MG capsule Take 20 mg by mouth daily. 10/10/2014: Only takes prn  . potassium chloride SA (K-DUR,KLOR-CON) 20 MEQ tablet Take 1 tablet (20 mEq total) by mouth daily.   . rosuvastatin (CRESTOR) 20 MG tablet TAKE 1 TABLET(20 MG) BY MOUTH DAILY   . SYNTHROID 50 MCG tablet TAKE 1 TABLET (50 MCG) DAILY BEFORE BREAKFAST   . timolol (BETIMOL) 0.5 % ophthalmic solution Place 1 drop into both eyes every morning.   . timolol (TIMOPTIC) 0.5 % ophthalmic solution INSTILL 1 DROP INTO BOTH EYES QAM UTD 04/13/2015: Received from: External Pharmacy  . Travoprost, BAK Free, (TRAVATAN) 0.004 % SOLN ophthalmic solution Place 1 drop into both eyes at bedtime.    Facility-Administered Encounter Medications as of 04/13/2015  Medication  . influenza  inactive virus vaccine (FLUZONE/FLUARIX) injection 0.5 mL   Allergies  Allergen Reactions  . Penicillins Rash   ROS:  No fever, chills, URI symptoms, headaches, URI symptoms, cough, shortness of breath, chest pain, palpitations, GI or GU complaints, bleeding, bruising, rash, depression, anxiety or other complaints.  +stress--sister-in-law on hospice for small cell lung cancer that was just diagnosed at Thanksgiving.  PHYSICAL EXAM: BP 158/80 mmHg  Pulse 68  Ht 5' 0.75" (1.543 m)  Wt 158 lb (71.668 kg)  BMI 30.10 kg/m2  144/86 on repeat by MD, LA 162/82 on RA by MD  Well developed, pleasant female in no distress Neck: no lymphadenopathy, thyromegaly or mass, no bruit Heart: regular rate and rhythm without murmur Lungs: clear bilaterally Abdomen: soft, nontender, no organomegaly or mass Back: no CVA tenderness or spinal  tenderness Extremities: no edema, 2+ pulses Psych: normal mood, affect, hygiene and grooming  Lab Results  Component Value Date   CHOL 153 04/06/2015   HDL 34* 04/06/2015   LDLCALC 80 04/06/2015   TRIG 194* 04/06/2015   CHOLHDL 4.5 04/06/2015   Fasting glucose 104  Lab Results  Component Value Date   HGBA1C 5.9* 04/06/2015   Lab Results  Component Value Date   ALT 36* 04/06/2015   AST 32 04/06/2015   ALKPHOS 34 04/06/2015   BILITOT 0.7 04/06/2015   ASSESSMENT/PLAN:  Impaired fasting glucose - encouraged daily exercise, weight loss, cut back on carbs/breads  Mixed hyperlipidemia - TG remains above goal, but remains <200. Reviewed lowfat, low cholesterol diet.  continue current rx meds, 3 fish oil daily.  - Plan: fenofibrate 54 MG tablet  Essential hypertension, benign - no true HTN dx; elevated today (and hasn't taken lasix today), so suspected.  low sodium diet, weight loss, regular exercise recommended  Hypothyroidism, unspecified hypothyroidism type - euthyroid by history; continue current dose  Meniere's disease, left - continue Lasix, low sodium diet  Gout without tophus, unspecified cause, unspecified chronicity, unspecified site - doing well on allopurinol (low dose) without any flares; continue   Slight discrepancy between arms, inconsistent; suspect some labile BP's with white coat component. Advised to check BP in both arms elsewhere and if consistently seeing >15 point difference, let us know.  Zostavax recommended. Written rx given for zostavax, for her to get at the pharmacy  F/u 6 mos--AWV/med check+, with fasting labs prior

## 2015-04-13 NOTE — Patient Instructions (Addendum)
Overall things are unchanged. Try and get back into a regular routine of daily exercise, and back to a healthier diet than when you were traveling--limit sugar, sweets, carbs. Continue all of your current medications.  Slight discrepancy in blood pressure between your arms was noted today. Check BP elsewhere in both arms and if consistently seeing >15 point difference, let us know.  Return in 6 months with fasting labs prior

## 2015-04-28 ENCOUNTER — Encounter: Payer: Self-pay | Admitting: Family Medicine

## 2015-05-25 ENCOUNTER — Other Ambulatory Visit: Payer: Self-pay | Admitting: Family Medicine

## 2015-07-09 IMAGING — MG MM SCREEN MAMMOGRAM BILATERAL
4 series · 4 of 4 positions shown · non-contrast
Comparison: Previous exam(s)

CLINICAL DATA: Screening.

EXAM:
DIGITAL SCREENING BILATERAL MAMMOGRAM WITH CAD

[R CC]
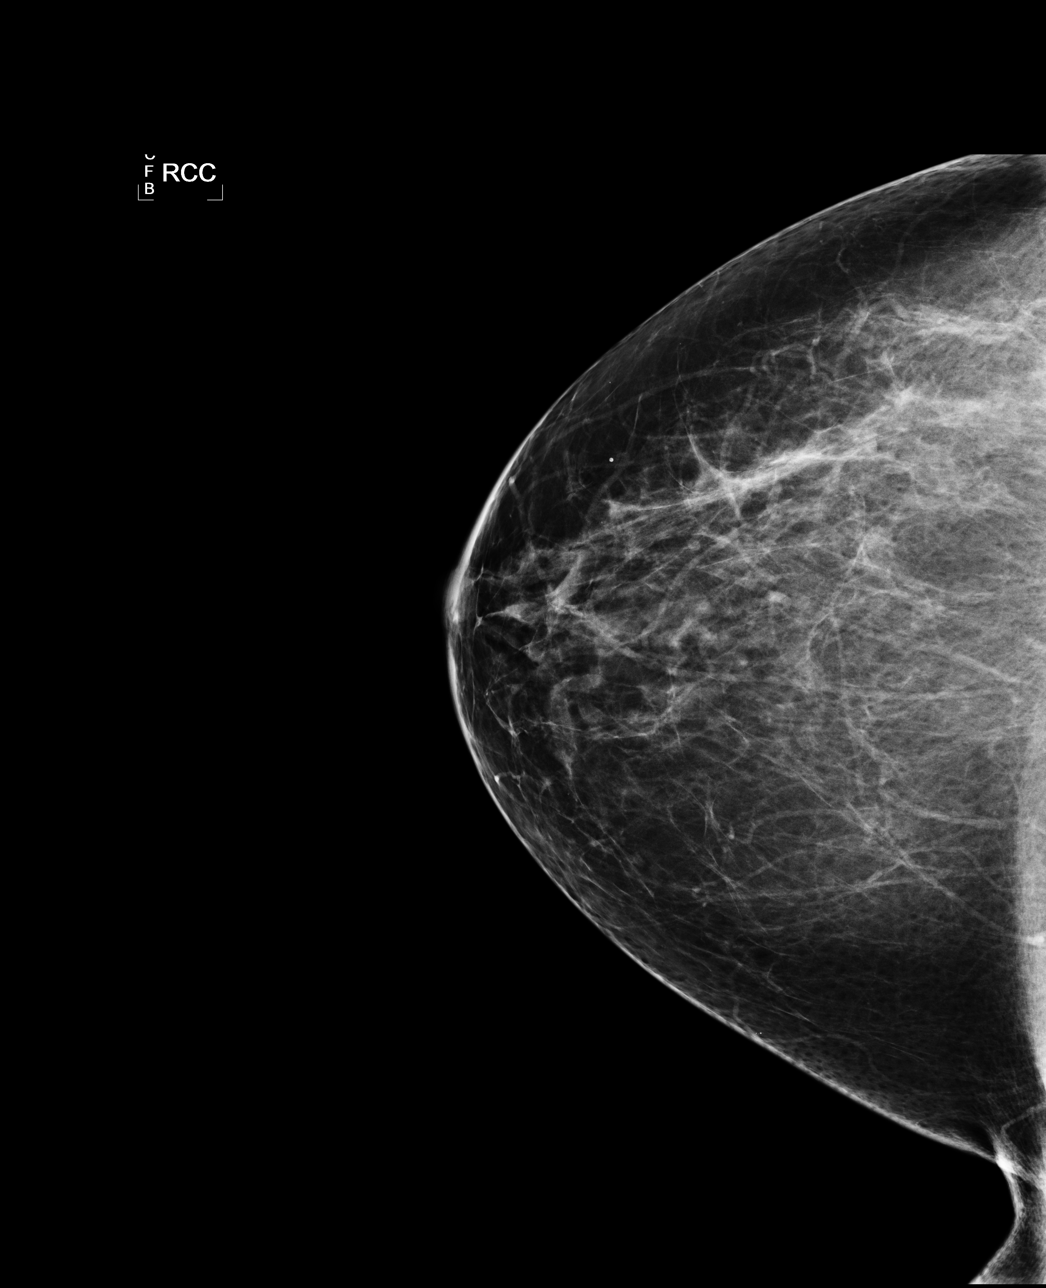

[L CC]
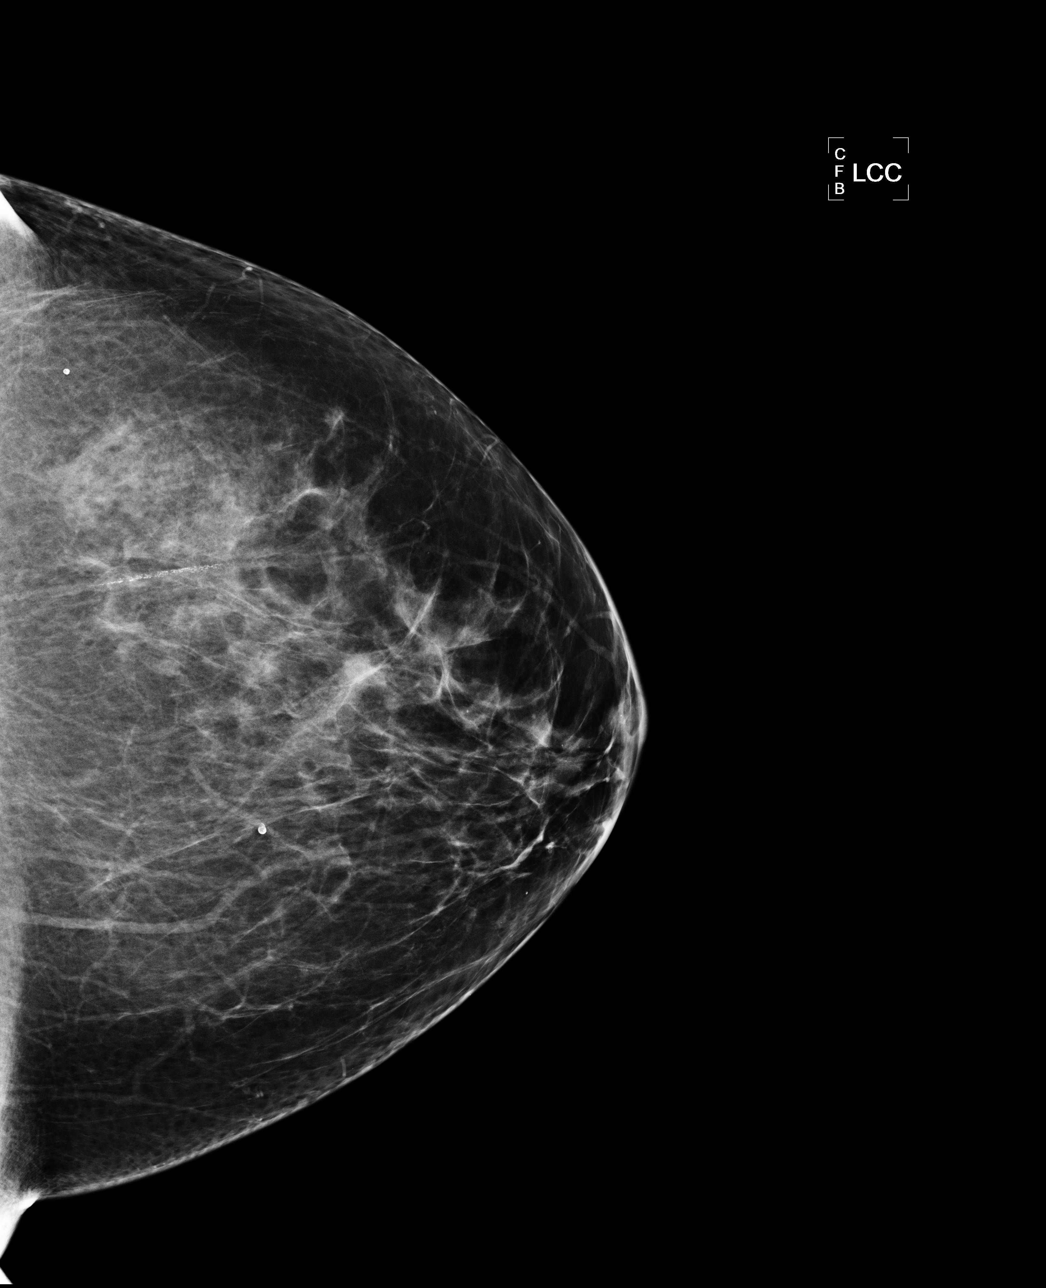

[L MLO]
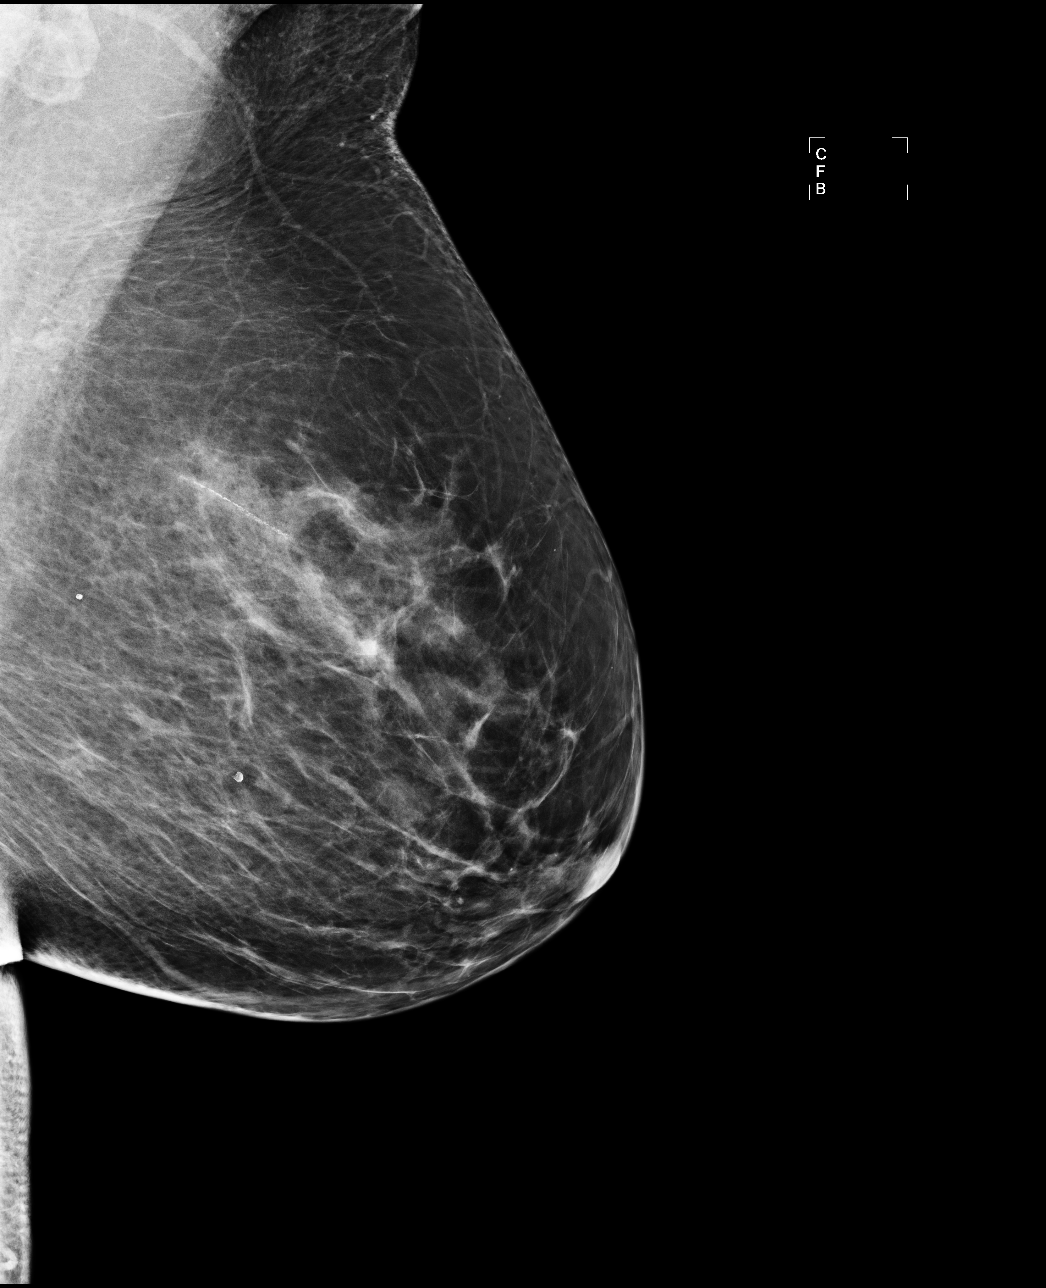

[R MLO]
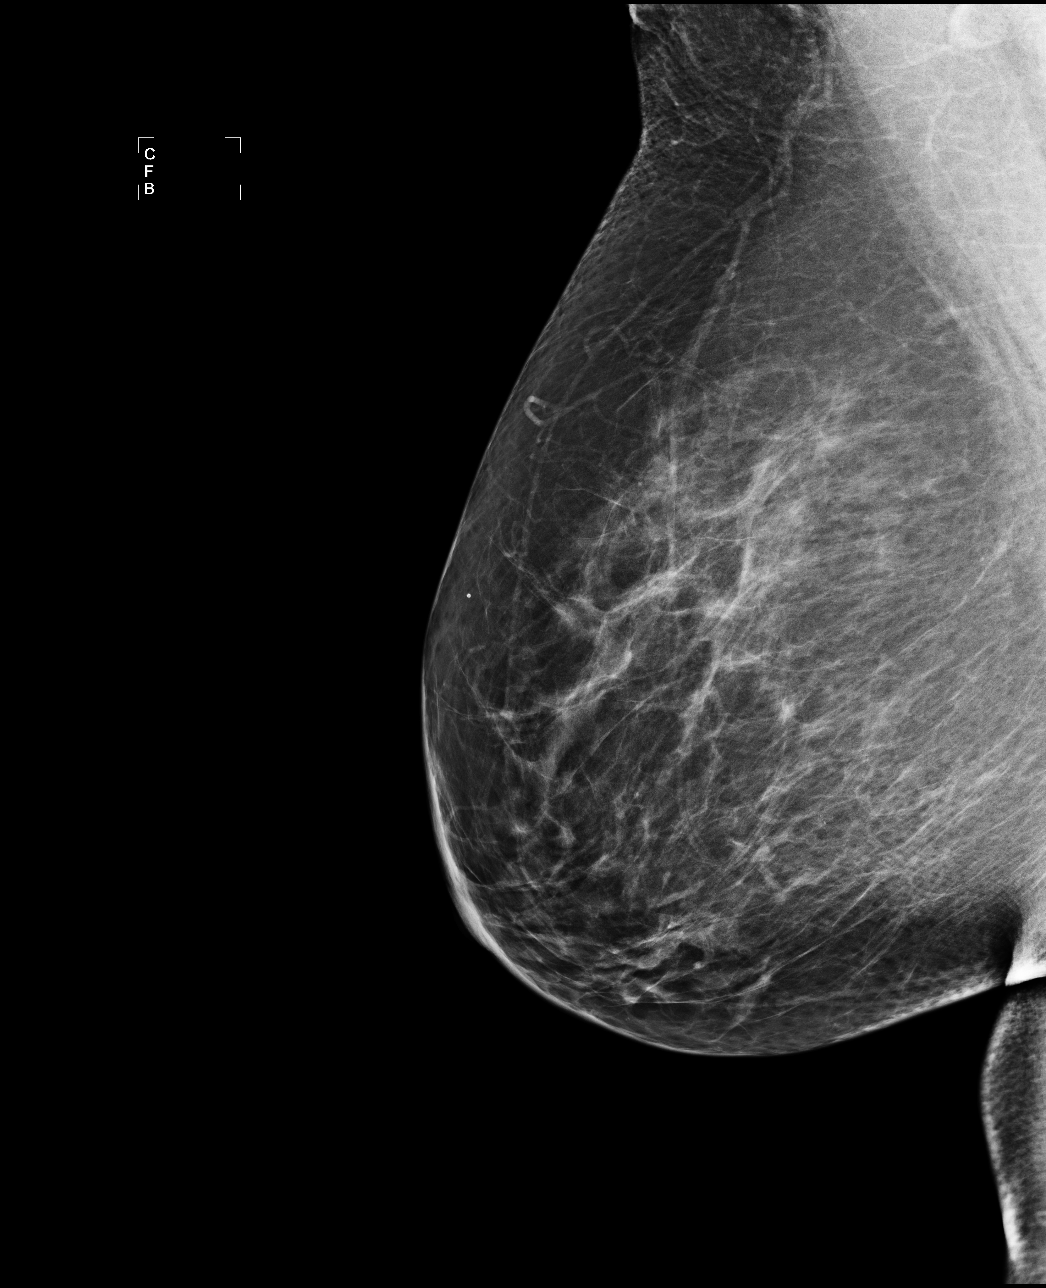

[4 of 4 positions shown; findings below may reference images not displayed]

ACR Breast Density Category b: There are scattered areas of
fibroglandular density.
FINDINGS: In the left breast, a possible mass warrants further evaluation with
spot compression views and possibly ultrasound. In the right breast,
no findings suspicious for malignancy.

Images were processed with CAD.
IMPRESSION: Further evaluation is suggested for possible mass in the left
breast.

RECOMMENDATION:
Diagnostic mammogram and possibly ultrasound of the left breast.
(Code:[id number])

The patient will be contacted regarding the findings, and additional
imaging will be scheduled.

BI-RADS CATEGORY  0: Incomplete. Need additional imaging evaluation
and/or prior mammograms for comparison.

## 2015-07-12 ENCOUNTER — Encounter: Payer: Self-pay | Admitting: Family Medicine

## 2015-07-13 ENCOUNTER — Other Ambulatory Visit: Payer: Self-pay | Admitting: Family Medicine

## 2015-08-19 ENCOUNTER — Other Ambulatory Visit: Payer: Self-pay | Admitting: Family Medicine

## 2015-10-06 ENCOUNTER — Other Ambulatory Visit: Payer: Self-pay | Admitting: Family Medicine

## 2015-10-13 ENCOUNTER — Other Ambulatory Visit: Payer: Medicare Other

## 2015-10-13 DIAGNOSIS — I10 Essential (primary) hypertension: Secondary | ICD-10-CM

## 2015-10-13 DIAGNOSIS — E039 Hypothyroidism, unspecified: Secondary | ICD-10-CM

## 2015-10-13 DIAGNOSIS — R7301 Impaired fasting glucose: Secondary | ICD-10-CM

## 2015-10-13 DIAGNOSIS — E782 Mixed hyperlipidemia: Secondary | ICD-10-CM

## 2015-10-13 DIAGNOSIS — Z5181 Encounter for therapeutic drug level monitoring: Secondary | ICD-10-CM

## 2015-10-13 LAB — CBC WITH DIFFERENTIAL/PLATELET
BASOS ABS: 58 {cells}/uL (ref 0–200)
BASOS PCT: 1 %
EOS ABS: 406 {cells}/uL (ref 15–500)
Eosinophils Relative: 7 %
HEMATOCRIT: 41 % (ref 35.0–45.0)
HEMOGLOBIN: 13.5 g/dL (ref 11.7–15.5)
Lymphocytes Relative: 30 %
Lymphs Abs: 1740 cells/uL (ref 850–3900)
MCH: 29.7 pg (ref 27.0–33.0)
MCHC: 32.9 g/dL (ref 32.0–36.0)
MCV: 90.3 fL (ref 80.0–100.0)
MONO ABS: 522 {cells}/uL (ref 200–950)
MPV: 10.3 fL (ref 7.5–12.5)
Monocytes Relative: 9 %
NEUTROS ABS: 3074 {cells}/uL (ref 1500–7800)
Neutrophils Relative %: 53 %
Platelets: 195 10*3/uL (ref 140–400)
RBC: 4.54 MIL/uL (ref 3.80–5.10)
RDW: 14.2 % (ref 11.0–15.0)
WBC: 5.8 10*3/uL (ref 4.0–10.5)

## 2015-10-13 LAB — COMPREHENSIVE METABOLIC PANEL
ALBUMIN: 4 g/dL (ref 3.6–5.1)
ALT: 27 U/L (ref 6–29)
AST: 25 U/L (ref 10–35)
Alkaline Phosphatase: 30 U/L — ABNORMAL LOW (ref 33–130)
BUN: 15 mg/dL (ref 7–25)
CHLORIDE: 104 mmol/L (ref 98–110)
CO2: 24 mmol/L (ref 20–31)
Calcium: 9.1 mg/dL (ref 8.6–10.4)
Creat: 0.72 mg/dL (ref 0.60–0.93)
Glucose, Bld: 115 mg/dL — ABNORMAL HIGH (ref 65–99)
POTASSIUM: 3.7 mmol/L (ref 3.5–5.3)
Sodium: 138 mmol/L (ref 135–146)
TOTAL PROTEIN: 6.5 g/dL (ref 6.1–8.1)
Total Bilirubin: 0.6 mg/dL (ref 0.2–1.2)

## 2015-10-13 LAB — HEMOGLOBIN A1C
Hgb A1c MFr Bld: 5.7 % — ABNORMAL HIGH (ref <5.7)
MEAN PLASMA GLUCOSE: 117 mg/dL

## 2015-10-13 LAB — LIPID PANEL
Cholesterol: 134 mg/dL (ref 125–200)
HDL: 35 mg/dL — ABNORMAL LOW (ref >=46)
LDL CALC: 64 mg/dL (ref <130)
TRIGLYCERIDES: 177 mg/dL — AB (ref <150)
Total CHOL/HDL Ratio: 3.8 Ratio (ref <=5.0)
VLDL: 35 mg/dL — AB (ref <30)

## 2015-10-13 LAB — TSH: TSH: 3.64 m[IU]/L

## 2015-10-13 LAB — URIC ACID: URIC ACID, SERUM: 5.1 mg/dL (ref 2.5–7.0)

## 2015-10-18 ENCOUNTER — Other Ambulatory Visit (HOSPITAL_COMMUNITY)
Admission: RE | Admit: 2015-10-18 | Discharge: 2015-10-18 | Disposition: A | Discharge status: Home / Self Care | Payer: Medicare Other | Source: Ambulatory Visit | Attending: Family Medicine | Admitting: Family Medicine

## 2015-10-18 ENCOUNTER — Ambulatory Visit (INDEPENDENT_AMBULATORY_CARE_PROVIDER_SITE_OTHER): Payer: Medicare Other | Admitting: Family Medicine

## 2015-10-18 ENCOUNTER — Encounter: Payer: Self-pay | Admitting: Family Medicine

## 2015-10-18 VITALS — BP 132/78 | HR 88 | Resp 18 | Ht 60.5 in | Wt 156.2 lb

## 2015-10-18 DIAGNOSIS — M109 Gout, unspecified: Secondary | ICD-10-CM

## 2015-10-18 DIAGNOSIS — I10 Essential (primary) hypertension: Secondary | ICD-10-CM

## 2015-10-18 DIAGNOSIS — Z0000 Encounter for general adult medical examination without abnormal findings: Secondary | ICD-10-CM | POA: Diagnosis not present

## 2015-10-18 DIAGNOSIS — E039 Hypothyroidism, unspecified: Secondary | ICD-10-CM | POA: Diagnosis not present

## 2015-10-18 DIAGNOSIS — H8102 Meniere's disease, left ear: Secondary | ICD-10-CM | POA: Diagnosis not present

## 2015-10-18 DIAGNOSIS — Z01419 Encounter for gynecological examination (general) (routine) without abnormal findings: Secondary | ICD-10-CM | POA: Diagnosis not present

## 2015-10-18 DIAGNOSIS — Z1151 Encounter for screening for human papillomavirus (HPV): Secondary | ICD-10-CM | POA: Diagnosis present

## 2015-10-18 DIAGNOSIS — R7301 Impaired fasting glucose: Secondary | ICD-10-CM | POA: Diagnosis not present

## 2015-10-18 DIAGNOSIS — Z8249 Family history of ischemic heart disease and other diseases of the circulatory system: Secondary | ICD-10-CM

## 2015-10-18 DIAGNOSIS — K219 Gastro-esophageal reflux disease without esophagitis: Secondary | ICD-10-CM | POA: Diagnosis not present

## 2015-10-18 DIAGNOSIS — M8580 Other specified disorders of bone density and structure, unspecified site: Secondary | ICD-10-CM | POA: Diagnosis not present

## 2015-10-18 DIAGNOSIS — E782 Mixed hyperlipidemia: Secondary | ICD-10-CM

## 2015-10-18 DIAGNOSIS — Z5181 Encounter for therapeutic drug level monitoring: Secondary | ICD-10-CM

## 2015-10-18 MED ORDER — FENOFIBRATE 54 MG PO TABS: 54.0000 mg | ORAL_TABLET | Freq: Every day | ORAL | Status: DC

## 2015-10-18 MED ORDER — ALLOPURINOL 100 MG PO TABS: 100.0000 mg | ORAL_TABLET | Freq: Every day | ORAL | Status: DC

## 2015-10-18 MED ORDER — ROSUVASTATIN CALCIUM 20 MG PO TABS: ORAL_TABLET | ORAL | Status: DC

## 2015-10-18 NOTE — Patient Instructions (Addendum)
  HEALTH MAINTENANCE RECOMMENDATIONS:  It is recommended that you get at least 30 minutes of aerobic exercise at least 5 days/week (for weight loss, you may need as much as 60-90 minutes). This can be any activity that gets your heart rate up. This can be divided in 10-15 minute intervals if needed, but try and build up your endurance at least once a week.  Weight bearing exercise is also recommended twice weekly.  Eat a healthy diet with lots of vegetables, fruits and fiber.  "Colorful" foods have a lot of vitamins (ie green vegetables, tomatoes, red peppers, etc).  Limit sweet tea, regular sodas and alcoholic beverages, all of which has a lot of calories and sugar.  Up to 1 alcoholic drink daily may be beneficial for women (unless trying to lose weight, watch sugars).  Drink a lot of water.  Calcium recommendations are 1200-1500 mg daily (1500 mg for postmenopausal women or women without ovaries), and vitamin D 1000 IU daily.  This should be obtained from diet and/or supplements (vitamins), and calcium should not be taken all at once, but in divided doses.  Monthly self breast exams and yearly mammograms for women over the age of 27 is recommended.  Sunscreen of at least SPF 30 should be used on all sun-exposed parts of the skin when outside between the hours of 10 am and 4 pm (not just when at beach or pool, but even with exercise, golf, tennis, and yard work!)  Use a sunscreen that says "broad spectrum" so it covers both UVA and UVB rays, and make sure to reapply every 1-2 hours.  Remember to change the batteries in your smoke detectors when changing your clock times in the spring and fall.  Use your seat belt every time you are in a car, and please drive safely and not be distracted with cell phones and texting while driving.   Ms. Kidney , Thank you for taking time to come for your Medicare Wellness Visit. I appreciate your ongoing commitment to your health goals. Please review the following  plan we discussed and let me know if I can assist you in the future.   These are the goals we discussed: Goals    None      This is a list of the screening recommended for you and due dates:  Health Maintenance  Topic Date Due  . Flu Shot  10/31/2015  . Mammogram  11/23/2016  . Colon Cancer Screening  03/01/2018  . Tetanus Vaccine  03/23/2022  . DEXA scan (bone density measurement)  Completed  . Shingles Vaccine  Completed  . Pneumonia vaccines  Completed   Get high dose flu shot when available in the Fall (Sept/October). Mammogram is due 10/2015 (not 2018 as stated above) Please return your stool sample in the kit provided. Please schedule your bone density for the same time you have your mammogram, at the [facility], to follow up on the osteopenia.  I recommend getting ultrasound screening for abdominal aortic aneurysm (and chest x-ray to look at the thoracic aorta).  The ultrasound will need to be scheduled for you and you will be contacted. Chest x-rays do not require appointment--order is in the computer for you to go to [place] at your convenience.

## 2015-10-18 NOTE — Progress Notes (Signed)
Chief Complaint  Patient presents with  . Medicare Wellness    concerns with blood pressure- elevated reading at home(worse in past 6 months) reports stress. has already had lab work performed. Mother has suffered aortic anuersym in chest. had to have surgery to repair. History updated per pt request.    [name] is a 75 y.o. female who presents for annual wellness visit and follow-up on chronic medical conditions.  She has no specific concerns or complaints today.  Mixed hyperlipidemia: She has been trying to follow a lowfat diet. She only rarely eats fried foods. She admits her downfall is pastas, breads (with butter), but she cut back on this some. She reports compliance with her medications (Crestor, fenofibrate and fish oil), and no side effects. She is taking 3 fish oil daily.  Meniere's disease--she had one episode of vertigo [place] in [place] (eating bavarian food, high in salt)--had a small dizzy spell, better the next day. No problems with vertigo since then. She has decreased hearing in the left ear (chronic, unchanged). She hasn't seen ENT since moving here--plans to, but hasn't had time. She takes her lasix and potassium without side effect, muscle cramps. She periodically checks her BP's elsewhere, and it has been up.  She bought a new BP monitor.  BP's have been ranging 156-158/high 80's-low 90's.  She denies headaches, chest pain, DOE. No true history of hypertension, but is on lasix just for the Meniere's disease.  Gout--no flare in a few years. No side effects to allopurinol.  GERD: She is taking the prilosec now just as needed, just 1-2x/month. Denies any cough with eating, which had been her manifestation of reflux (not heartburn). Denies dysphagia.  Hypothyroidism: She is compliant with taking her Synthroid on empty stomach, separate from other medications. She never really had any symptoms related to her thyroid. She denies any changes in  hair/skin/bowels/moods.   Immunization History  Administered Date(s) Administered  . Influenza Split 11/29/2010, 01/14/2012  . Influenza, High Dose Seasonal PF 01/26/2013, 12/30/2013, 01/12/2015  . Pneumococcal Conjugate-13 10/06/2013  . Pneumococcal Polysaccharide-23 01/24/2010  . Tdap 03/23/2012  . Zoster 04/21/2015   Last Pap smear: 09/2012, normal, with no high risk HPV Last mammogram:10/2014, plans to schedule Last colonoscopy: 2009. Denies any h/o polyps  Last DEXA: 10/15/13 -1.9 for L femoral neck; on alendronate since 2013. Dentist: regularly, twice yearly Ophtho: every 6 months Exercise: walks 3 miles daily, 7 days/week (not if weather is bad); occasional hand weights.  Other doctors caring for patient include: Ophtho: Dr. Martha ClanHutto at [facility] Dentist: Dr. Onalee Huaavid Mendalinich Ortho: Dr. Thurston HoleWainer GI: None locally, seen in [place] (will use Dr. Ewing SchleinMagod when due again) Derm: Dr. Lovenia KimSteinhelfer ([facility]) Podiatrist: Dr. Charlsie Merlesegal  Depression screen: See epic questionnaire. Negative screen ADL screen: See epic questionnaire. Notable for hearing loss in L ear (related to meniere's).  Plans to have hearing rechecked soon. Functional screen is otherwise normal. Fall screen is negative.  End of Life Discussion: Patient has a living will and medical power of attorney  Past Medical History  Diagnosis Date  . Mixed hyperlipidemia   . Gout attack 2011  . Meniere disease   . Hypertension   . Low tension glaucoma     [facility]  . Vitamin D deficiency   . Osteopenia   . Hypothyroid   . Impaired fasting glucose     Past Surgical History  Procedure Laterality Date  . Ganglion cyst excision  bilateral, 20 years apart  bilateral wrists  . Rotator cuff repair  10/2010    Right (Dr. Thurston Hole)    Social History   Social History  . Marital Status: Married    Spouse Name: N/A  . Number of Children: 2  . Years of Education: N/A   Occupational History  . retired Paramedic, Doctor, hospital)    Social History Main Topics  . Smoking status: Never Smoker   . Smokeless tobacco: Never Used  . Alcohol Use: Yes     Comment: 3 drinks a month and occasion glass of wine.  . Drug Use: No  . Sexual Activity:    Partners: Male   Other Topics Concern  . Not on file   Social History Narrative   Lives with husband.  Daughter lives in [place], son moved from [place] to [place], [place].  No pets.  Daughter is Ship broker. She chauffeurs her granddaughters around (stays with them if their parents are OOT)    Family History  Problem Relation Age of Onset  . Diabetes Mother   . Hyperlipidemia Mother   . Hypertension Mother   . Arthritis Mother   . Hypothyroidism Mother   . Aortic aneurysm Mother 38    repaired after it started leaking; thoracic (and small renal)  . Cancer Father     kidney CA, and bone CA  . Anxiety disorder Sister   . Hyperlipidemia Sister   . Hypertension Sister   . Arthritis Maternal Grandmother   . Heart disease Maternal Grandmother   . Diabetes Maternal Grandfather   . Heart disease Maternal Grandfather   . Stroke Maternal Grandfather   . Alcohol abuse Sister   . Hyperlipidemia Daughter   . Hyperlipidemia Son     Outpatient Encounter Prescriptions as of 10/18/2015  Medication Sig Note  . alendronate (FOSAMAX) 70 MG tablet TAKE 1 TABLET BY MOUTH EVERY 7 DAYS WITH A FULL GLASS OF WATER. TAKE ON AN EMPTY STOMACH   . allopurinol (ZYLOPRIM) 100 MG tablet TAKE 1 TABLET BY MOUTH DAILY   . Calcium-Magnesium-Vitamin D (CITRACAL SLOW RELEASE PO) Take 1 tablet by mouth daily. 10/18/2015: Now taking Caltrate 1200mg  slow release, with Vitamin D  . fenofibrate 54 MG tablet Take 1 tablet (54 mg total) by mouth daily.   . furosemide (LASIX) 20 MG tablet Take 1 tablet (20 mg total) by mouth daily.   . Omega-3 Fatty Acids (FISH OIL) 1200 MG CAPS Take 2 capsules by mouth daily. 04/28/2014: Taking 3 daily now  . omeprazole (PRILOSEC) 20 MG capsule Take  20 mg by mouth daily. 10/10/2014: Only takes prn  . potassium chloride SA (K-DUR,KLOR-CON) 20 MEQ tablet Take 1 tablet (20 mEq total) by mouth daily.   . rosuvastatin (CRESTOR) 20 MG tablet TAKE 1 TABLET(20 MG) BY MOUTH DAILY   . SYNTHROID 50 MCG tablet TAKE 1 TABLET (50 MCG) DAILY BEFORE BREAKFAST   . timolol (TIMOPTIC) 0.5 % ophthalmic solution INSTILL 1 DROP INTO BOTH EYES QAM UTD 04/13/2015: Received from: External Pharmacy  . [DISCONTINUED] timolol (BETIMOL) 0.5 % ophthalmic solution Place 1 drop into both eyes every morning.   . Travoprost, BAK Free, (TRAVATAN) 0.004 % SOLN ophthalmic solution Place 1 drop into both eyes at bedtime.    Facility-Administered Encounter Medications as of 10/18/2015  Medication  . influenza  inactive virus vaccine (FLUZONE/FLUARIX) injection 0.5 mL    Allergies  Allergen Reactions  . Penicillins Rash    ROS: The patient denies anorexia, fever, headaches; vision changes, decreased hearing (  diminished on left, chronic/unchanged), ear pain, sore throat, breast concerns, chest pain, palpitations, dizziness, syncope, dyspnea on exertion, cough, swelling, nausea, vomiting, diarrhea, constipation, abdominal pain, melena, hematochezia, indigestion/heartburn (rare, about once a month), hematuria, incontinence, dysuria, vaginal bleeding, discharge, or odor, but has slight external vaginal itch (chronic, unchanged). denies genital lesions, numbness, tingling, weakness, tremor, depression, anxiety, abnormal bleeding/bruising, or enlarged lymph nodes.  +arthritis in her hands usually just first thing in the morning has some stiffness (doesn't require meds, does better after running them in hot water); no other joint pains. No gout flares. +weight gain (up 3# from her physical last year)  PHYSICAL EXAM:  BP 140/82 mmHg  Pulse 88  Resp 18  Ht 5' 0.5" (1.537 m)  Wt 159 lb 3.2 oz (72.213 kg)  BMI 30.57 kg/m2  132/78 on repeat by MD  General Appearance:  Alert,  cooperative, no distress, appears stated age   Head:  Normocephalic, without obvious abnormality, atraumatic   Eyes:  PERRL, conjunctiva/corneas clear, EOM's intact, fundi  benign   Ears:  Normal TM's and external ear canals   Nose:  Nares normal, mucosa normal, no drainage or sinus tenderness   Throat:  Lips, mucosa, and tongue normal; teeth and gums normal   Neck:  Supple, no lymphadenopathy; thyroid: no enlargement/tenderness/nodules; no carotid  bruit or JVD   Back:  Spine nontender, no curvature, ROM normal, no CVA tenderness   Lungs:  Clear to auscultation bilaterally without wheezes, rales or ronchi; respirations unlabored   Chest Wall:  No tenderness or deformity   Heart:  Regular rate and rhythm, S1 and S2 normal, no murmur, rub  or gallop   Breast Exam:  No tenderness, masses, or nipple discharge or inversion. No axillary lymphadenopathy   Abdomen:  Soft, non-tender, nondistended, normoactive bowel sounds,  no masses, no hepatosplenomegaly   Genitalia:  Normal external genitalia without lesions. Mild atrophic changes of external genitalia/labia majora. BUS and vagina normal; no cervical lesions, discharge or cervical motion tenderness. No abnormal vaginal discharge. Uterus and adnexa not enlarged, nontender, no masses. Pap performed   Rectal:  Normal tone, no masses or tenderness; guaiac negative stool   Extremities:  No clubbing, cyanosis or edema.   Pulses:  2+ and symmetric all extremities   Skin:  Skin color, texture, turgor normal, no rashes.   Lymph nodes:  Cervical, supraclavicular, and axillary nodes normal   Neurologic:  CNII-XII intact, normal strength, sensation and gait; reflexes 2+ and symmetric throughout    Psych:    Normal mood, affect, hygiene and grooming              Lab Results  Component Value Date   WBC 5.8 10/13/2015   HGB 13.5 10/13/2015   HCT  41.0 10/13/2015   MCV 90.3 10/13/2015   PLT 195 10/13/2015   Lab Results  Component Value Date   CHOL 134 10/13/2015   HDL 35* 10/13/2015   LDLCALC 64 10/13/2015   TRIG 177* 10/13/2015   CHOLHDL 3.8 10/13/2015   Lab Results  Component Value Date   TSH 3.64 10/13/2015   Lab Results  Component Value Date   HGBA1C 5.7* 10/13/2015   Lab Results  Component Value Date   LABURIC 5.1 10/13/2015     Chemistry      Component Value Date/Time   NA 138 10/13/2015 0751   K 3.7 10/13/2015 0751   CL 104 10/13/2015 0751   CO2 24 10/13/2015 0751   BUN 15 10/13/2015 0751  CREATININE 0.72 10/13/2015 0751      Component Value Date/Time   CALCIUM 9.1 10/13/2015 0751   ALKPHOS 30* 10/13/2015 0751   AST 25 10/13/2015 0751   ALT 27 10/13/2015 0751   BILITOT 0.6 10/13/2015 0751     Fasting glucose: 115   ASSESSMENT/PLAN:  Medicare annual wellness visit, subsequent  Essential hypertension, benign - high on new monitor, okay here--bring machine in for check. Weight loss encouraged  Mixed hyperlipidemia - improved, borderline. Continue current meds. reviewed lowfat diet - Plan: fenofibrate 54 MG tablet, rosuvastatin (CRESTOR) 20 MG tablet  Gastroesophageal reflux disease without esophagitis - controlled with diet, rarely needs meds  Osteopenia - due for f/u DEXA - Plan: DG Bone Density  Hypothyroidism, unspecified hypothyroidism type - adequately replaced on current dose  Gout without tophus, unspecified cause, unspecified chronicity, unspecified site - continue low dose allopurinol - Plan: allopurinol (ZYLOPRIM) 100 MG tablet  Impaired fasting glucose - overall A1c improved from 6 months ago.  Encouraged prior diet and weight loss; continue daily exercise.  Meniere's disease, left  Mixed hyperlipidemia - TG remains above goal, but remains <200. Reviewed lowfat, low cholesterol diet.  continue current rx meds, 3 fish oil daily.  - Plan: fenofibrate 54 MG tablet, rosuvastatin  (CRESTOR) 20 MG tablet  Family history of aortic aneurysm - Plan: DG Chest 2 View, US ABDOMINAL AORTA SCREENING AAA  Encounter for routine gynecological examination - Plan: Cytology - PAP [place]   F/u 6 months with labs prior  Discussed monthly self breast exams and yearly mammograms; at least 30 minutes of aerobic activity at least 5 days/week, weight-bearing exercise at least 2x/wk; proper sunscreen use reviewed; healthy diet, including goals of calcium and vitamin D intake and alcohol recommendations (less than or equal to 1 drink/day) reviewed; regular seatbelt use; changing batteries in smoke detectors. Immunization recommendations discussed; yearly high dose flu shots are recommended.  Colonoscopy recommendations reviewed--UTD. DEXA due  MOST form reviewed and updated. Full code, Full Care Pt will get [place] copies of her Living Will and Healthcare POA  She reports accumulating some meds, not all rx's needed. She reports having plenty of potassium, so not refilled today.  Pharmacy will contact us when needed. Just got 3 months of alendronate. Thyroid also just recently refilled.  Medicare Attestation I have personally reviewed: The patient's medical and social history Their use of alcohol, tobacco or illicit drugs Their current medications and supplements The patient's functional ability including ADLs,fall risks, home safety risks, cognitive, and hearing and visual impairment Diet and physical activities Evidence for depression or mood disorders  The patient's weight, height, and BMI have been recorded in the chart.  I have made referrals, counseling, and provided education to the patient based on review of the above and I have provided the patient with a written personalized care plan for preventive services.     Ashly Yepez A, MD   10/18/2015

## 2015-10-19 ENCOUNTER — Other Ambulatory Visit: Payer: Self-pay

## 2015-10-19 DIAGNOSIS — Z8249 Family history of ischemic heart disease and other diseases of the circulatory system: Secondary | ICD-10-CM

## 2015-10-20 LAB — CYTOLOGY - PAP

## 2015-10-24 ENCOUNTER — Other Ambulatory Visit: Payer: Self-pay | Admitting: Family Medicine

## 2015-10-24 DIAGNOSIS — Z1231 Encounter for screening mammogram for malignant neoplasm of breast: Secondary | ICD-10-CM

## 2015-10-26 ENCOUNTER — Telehealth: Payer: Self-pay | Admitting: *Deleted

## 2015-10-26 ENCOUNTER — Other Ambulatory Visit: Payer: Medicare Other

## 2015-10-26 DIAGNOSIS — Z0000 Encounter for general adult medical examination without abnormal findings: Secondary | ICD-10-CM

## 2015-10-26 NOTE — Telephone Encounter (Signed)
Patient came into have her bp machine checked. I got 152/102 and machine was 154/102. Waited 10 minutes and did again per patient request. I got 150/98 and her machine was 145/97.

## 2015-10-26 NOTE — Telephone Encounter (Signed)
Machine is accurate. This BP is a lot higher than what we got in the office when she was here last week.  Because of this, I'm a little hesitant to start on blood pressure medications right off the bat.  Have her really look at labels and follow low sodium diet (limiting to 2500mg  of sodium daily); exercise daily, attempt to lose a few pounds, and return in 2-3 weeks with her list of blood pressures to discuss various medications if they are remaining elevated.  Goal BP's are <135-140 (ideally <130)/85-90 (ideally <80). Things that raise BP--pain, salt, decongestants, illness, lack of sleep, sleep apnea. Things that can lower BP--regular exercise, weight loss, cutting back on salt/sodium in diet, stress reduction, adequate sleep, treatment of sleep apnea.  When she keeps her list of BP's, be sure she keeps a "comment" section where she can report reasons for fluctuations (had headache, had ham or chinese food, back pain, etc etc)

## 2015-10-26 NOTE — Telephone Encounter (Signed)
Patient advised, she will next week to schedule.

## 2015-10-27 ENCOUNTER — Ambulatory Visit
Admission: RE | Admit: 2015-10-27 | Discharge: 2015-10-27 | Disposition: A | Discharge status: Home / Self Care | Payer: Self-pay | Source: Ambulatory Visit | Attending: Family Medicine | Admitting: Family Medicine

## 2015-10-27 ENCOUNTER — Ambulatory Visit
Admission: RE | Admit: 2015-10-27 | Discharge: 2015-10-27 | Disposition: A | Discharge status: Home / Self Care | Payer: Medicare Other | Source: Ambulatory Visit | Attending: Family Medicine | Admitting: Family Medicine

## 2015-10-27 ENCOUNTER — Other Ambulatory Visit: Payer: Self-pay

## 2015-10-27 ENCOUNTER — Encounter: Payer: Self-pay | Admitting: Family Medicine

## 2015-10-27 DIAGNOSIS — Z8249 Family history of ischemic heart disease and other diseases of the circulatory system: Secondary | ICD-10-CM

## 2015-10-27 DIAGNOSIS — I700 Atherosclerosis of aorta: Secondary | ICD-10-CM | POA: Insufficient documentation

## 2015-10-27 LAB — FECAL OCCULT BLOOD, IMMUNOCHEMICAL: Fecal Occult Blood: NEGATIVE

## 2015-11-08 ENCOUNTER — Telehealth: Payer: Self-pay | Admitting: Family Medicine

## 2015-11-08 DIAGNOSIS — H8102 Meniere's disease, left ear: Secondary | ICD-10-CM

## 2015-11-08 MED ORDER — POTASSIUM CHLORIDE CRYS ER 20 MEQ PO TBCR
20.0000 meq | EXTENDED_RELEASE_TABLET | Freq: Every day | ORAL | 1 refills | Status: DC
Start: 2015-11-08 — End: 2016-05-04

## 2015-11-08 NOTE — Telephone Encounter (Signed)
Rcvd refill request for Potassium 20 meq #90

## 2015-11-08 NOTE — Telephone Encounter (Signed)
Sent med to pharmacy  

## 2015-11-20 ENCOUNTER — Encounter: Payer: Self-pay | Admitting: Family Medicine

## 2015-11-20 ENCOUNTER — Telehealth: Payer: Self-pay | Admitting: *Deleted

## 2015-11-20 ENCOUNTER — Ambulatory Visit (INDEPENDENT_AMBULATORY_CARE_PROVIDER_SITE_OTHER): Payer: Medicare Other | Admitting: Family Medicine

## 2015-11-20 VITALS — BP 148/86 | HR 72 | Ht 60.5 in | Wt 156.8 lb

## 2015-11-20 DIAGNOSIS — I10 Essential (primary) hypertension: Secondary | ICD-10-CM | POA: Diagnosis not present

## 2015-11-20 DIAGNOSIS — R7301 Impaired fasting glucose: Secondary | ICD-10-CM

## 2015-11-20 DIAGNOSIS — H8102 Meniere's disease, left ear: Secondary | ICD-10-CM | POA: Diagnosis not present

## 2015-11-20 MED ORDER — LISINOPRIL 10 MG PO TABS: ORAL_TABLET | ORAL | 0 refills | Status: DC

## 2015-11-20 NOTE — Telephone Encounter (Signed)
Patient coming today

## 2015-11-20 NOTE — Patient Instructions (Signed)
  Start lisinopril 10mg  once daily.  Continue to monitor your blood pressure.  After 2 weeks, if your blood pressures remain consistently >140 systolic, then increase to 2 tablets daily (taken together).  Continue that dose until your follow-up. Bring your list of blood pressures to your visit.  Contact us sooner with any questions. Definitely contact us if you develop a significant dry cough.  We will be checking bloodwork at your follow-up visit to ensure that your body is handling this medication okay.

## 2015-11-20 NOTE — Telephone Encounter (Signed)
Looks like we verified that her monitor was accurate 7/27. BP's are all mid-upper 140's-160's (only once <140, at 135/84)/90's (86-100).  She needs OV scheduled to discuss starting medications

## 2015-11-20 NOTE — Telephone Encounter (Signed)
Patient dropped off blood pressure readings, I put the list in your folder.

## 2015-11-20 NOTE — Progress Notes (Signed)
Chief Complaint  Patient presents with  . Follow-up    start bp meds.    Patient was asked to follow up to discuss starting BP medications, after monitoring BP's regularly at home, and submitting them for my review.   BP's since 8/1 have been ranging from 135-172 (mostly 140's-150's)/86-100 (mostly upper 80's-90s).  Denies headaches, dizziness, chest pain, shortness of breath, edema. She has been exercising, and limiting the sodium intake.  Accuracy of her monitor has been verified.  PMH, PSH, SH reviewed   Outpatient Encounter Prescriptions as of 11/20/2015  Medication Sig Note  . alendronate (FOSAMAX) 70 MG tablet TAKE 1 TABLET BY MOUTH EVERY 7 DAYS WITH A FULL GLASS OF WATER. TAKE ON AN EMPTY STOMACH   . allopurinol (ZYLOPRIM) 100 MG tablet Take 1 tablet (100 mg total) by mouth daily.   . Calcium-Magnesium-Vitamin D (CITRACAL SLOW RELEASE PO) Take 1 tablet by mouth daily. 10/18/2015: Now taking Caltrate 1260m slow release, with Vitamin D  . Coenzyme Q10 (COQ10) 100 MG CAPS Take 100 mg by mouth daily.   . fenofibrate 54 MG tablet Take 1 tablet (54 mg total) by mouth daily.   . furosemide (LASIX) 20 MG tablet Take 1 tablet (20 mg total) by mouth daily.   . Omega-3 Fatty Acids (FISH OIL) 1200 MG CAPS Take 2 capsules by mouth daily. 04/28/2014: Taking 3 daily now  . omeprazole (PRILOSEC) 20 MG capsule Take 20 mg by mouth daily. 10/10/2014: Only takes prn  . potassium chloride SA (K-DUR,KLOR-CON) 20 MEQ tablet Take 1 tablet (20 mEq total) by mouth daily.   . rosuvastatin (CRESTOR) 20 MG tablet TAKE 1 TABLET(20 MG) BY MOUTH DAILY   . SYNTHROID 50 MCG tablet TAKE 1 TABLET (50 MCG) DAILY BEFORE BREAKFAST   . timolol (TIMOPTIC) 0.5 % ophthalmic solution INSTILL 1 DROP INTO BOTH EYES QAM UTD 04/13/2015: Received from: External Pharmacy  . Travoprost, BAK Free, (TRAVATAN) 0.004 % SOLN ophthalmic solution Place 1 drop into both eyes at bedtime.   . triamcinolone cream (KENALOG) 0.1 % Apply 1 application  topically 2 (two) times daily.  11/20/2015: Received from: External Pharmacy   Facility-Administered Encounter Medications as of 11/20/2015  Medication  . influenza  inactive virus vaccine (FLUZONE/FLUARIX) injection 0.5 mL   Allergies  Allergen Reactions  . Penicillins Rash    ROS: no headaches, dizziness, numbness, tingling, chest pain, palpitations, shortness of breath, edema, cough, URI symptoms, fevers, chills, skin concerns (saw derm and had some biopsies taken) or other complaints.  PHYSICAL EXAM:  BP (!) 148/86 (BP Location: Left Arm, Patient Position: Sitting, Cuff Size: Normal)   Pulse 72   Ht 5' 0.5" (1.537 m)   Wt 156 lb 12.8 oz (71.1 kg)   BMI 30.12 kg/m   Well developed, pleasant female, overweight, in good spirits HEENT: PERRL, EOMI, conjunctiva and sclera clear Neck: no lymphadenopathy, thyromegaly or bruit Heart: regular rate and rhythm without murmur Lungs: clear bilaterally Extremities: no edema, normal pulses Neuro: alert and oriented, cranial nerves intact; normal strength, gait. Psych: normal mood, affect, hygiene and grooming  ASSESSMENT/PLAN:  Essential hypertension, benign - Plan: lisinopril (PRINIVIL,ZESTRIL) 10 MG tablet  Meniere disease, left  Impaired fasting glucose   Patient has impaired fasting glucose. She has meniere's and is maintained on lasix, but BP's remain elevated.  Doubt BP would be at goal just by switching diuretic (to one which helps more with BP control), so instead, start ACEI given IFG.  Risks/side effects were reviewed in detail.  She has no contraindications. Will call to change to ARB if develops cough. F/u in 4 weeks with list of BP's.  Needs b-met at f/u. To continue low sodium diet, encouraged daily exercise and weight loss.    Start lisinopril 76m once daily.  Continue to monitor your blood pressure.  After 2 weeks, if your blood pressures remain consistently >>488systolic, then increase to 2 tablets daily (taken  together).  Continue that dose until your follow-up. Bring your list of blood pressures to your visit.  Contact [place] with any questions. Definitely contact [place] you develop a significant dry cough.  We will be checking bloodwork at your follow-up visit to ensure that your body is handling this medication okay.

## 2015-11-25 ENCOUNTER — Other Ambulatory Visit: Payer: Self-pay | Admitting: Family Medicine

## 2015-11-25 DIAGNOSIS — H8102 Meniere's disease, left ear: Secondary | ICD-10-CM

## 2015-11-27 ENCOUNTER — Encounter: Payer: Self-pay | Admitting: Family Medicine

## 2015-11-27 ENCOUNTER — Other Ambulatory Visit: Payer: Self-pay | Admitting: Family Medicine

## 2015-11-27 ENCOUNTER — Ambulatory Visit
Admission: RE | Admit: 2015-11-27 | Discharge: 2015-11-27 | Disposition: A | Discharge status: Home / Self Care | Payer: Medicare Other | Source: Ambulatory Visit | Attending: Family Medicine | Admitting: Family Medicine

## 2015-11-27 DIAGNOSIS — M8580 Other specified disorders of bone density and structure, unspecified site: Secondary | ICD-10-CM

## 2015-11-27 DIAGNOSIS — Z1231 Encounter for screening mammogram for malignant neoplasm of breast: Secondary | ICD-10-CM

## 2015-11-27 DIAGNOSIS — I10 Essential (primary) hypertension: Secondary | ICD-10-CM

## 2015-11-27 MED ORDER — LOSARTAN POTASSIUM 25 MG PO TABS: 25.0000 mg | ORAL_TABLET | Freq: Every day | ORAL | 1 refills | Status: DC

## 2015-12-13 ENCOUNTER — Encounter: Payer: Self-pay | Admitting: Family Medicine

## 2015-12-18 ENCOUNTER — Other Ambulatory Visit: Payer: Self-pay | Admitting: Family Medicine

## 2015-12-18 ENCOUNTER — Encounter: Payer: Self-pay | Admitting: Family Medicine

## 2015-12-18 ENCOUNTER — Ambulatory Visit (INDEPENDENT_AMBULATORY_CARE_PROVIDER_SITE_OTHER): Payer: Medicare Other | Admitting: Family Medicine

## 2015-12-18 VITALS — BP 142/78 | HR 100 | Temp 98.5°F | Ht 60.5 in | Wt 152.0 lb

## 2015-12-18 DIAGNOSIS — R509 Fever, unspecified: Secondary | ICD-10-CM | POA: Diagnosis not present

## 2015-12-18 DIAGNOSIS — R8290 Unspecified abnormal findings in urine: Secondary | ICD-10-CM | POA: Diagnosis not present

## 2015-12-18 DIAGNOSIS — B349 Viral infection, unspecified: Secondary | ICD-10-CM | POA: Diagnosis not present

## 2015-12-18 DIAGNOSIS — I10 Essential (primary) hypertension: Secondary | ICD-10-CM

## 2015-12-18 DIAGNOSIS — R1011 Right upper quadrant pain: Secondary | ICD-10-CM | POA: Diagnosis not present

## 2015-12-18 DIAGNOSIS — E860 Dehydration: Secondary | ICD-10-CM

## 2015-12-18 DIAGNOSIS — N390 Urinary tract infection, site not specified: Secondary | ICD-10-CM | POA: Diagnosis not present

## 2015-12-18 DIAGNOSIS — M545 Low back pain: Secondary | ICD-10-CM | POA: Diagnosis not present

## 2015-12-18 LAB — POCT URINALYSIS DIPSTICK
Bilirubin, UA: NEGATIVE
GLUCOSE UA: NEGATIVE
Ketones, UA: NEGATIVE
NITRITE UA: POSITIVE
SPEC GRAV UA: 1.025
UROBILINOGEN UA: 0.2
pH, UA: 6

## 2015-12-18 LAB — COMPREHENSIVE METABOLIC PANEL
ALBUMIN: 3.5 g/dL — AB (ref 3.6–5.1)
ALK PHOS: 80 U/L (ref 33–130)
ALT: 50 U/L — AB (ref 6–29)
AST: 39 U/L — AB (ref 10–35)
BILIRUBIN TOTAL: 0.8 mg/dL (ref 0.2–1.2)
BUN: 19 mg/dL (ref 7–25)
CO2: 24 mmol/L (ref 20–31)
CREATININE: 1.08 mg/dL — AB (ref 0.60–0.93)
Calcium: 9.2 mg/dL (ref 8.6–10.4)
Chloride: 101 mmol/L (ref 98–110)
GLUCOSE: 101 mg/dL — AB (ref 65–99)
Potassium: 3.4 mmol/L — ABNORMAL LOW (ref 3.5–5.3)
SODIUM: 138 mmol/L (ref 135–146)
Total Protein: 6.6 g/dL (ref 6.1–8.1)

## 2015-12-18 LAB — CBC WITH DIFFERENTIAL/PLATELET
BASOS PCT: 0 %
Basophils Absolute: 0 cells/uL (ref 0–200)
EOS PCT: 1 %
Eosinophils Absolute: 130 cells/uL (ref 15–500)
HEMATOCRIT: 39.1 % (ref 35.0–45.0)
HEMOGLOBIN: 13 g/dL (ref 11.7–15.5)
LYMPHS ABS: 1170 {cells}/uL (ref 850–3900)
Lymphocytes Relative: 9 %
MCH: 29.9 pg (ref 27.0–33.0)
MCHC: 33.2 g/dL (ref 32.0–36.0)
MCV: 89.9 fL (ref 80.0–100.0)
MONO ABS: 1820 {cells}/uL — AB (ref 200–950)
MPV: 10.1 fL (ref 7.5–12.5)
Monocytes Relative: 14 %
NEUTROS PCT: 76 %
Neutro Abs: 9880 cells/uL — ABNORMAL HIGH (ref 1500–7800)
Platelets: 227 10*3/uL (ref 140–400)
RBC: 4.35 MIL/uL (ref 3.80–5.10)
RDW: 14.3 % (ref 11.0–15.0)
WBC: 13 10*3/uL — AB (ref 4.0–10.5)

## 2015-12-18 MED ORDER — CIPROFLOXACIN HCL 500 MG PO TABS: 500.0000 mg | ORAL_TABLET | Freq: Two times a day (BID) | ORAL | 0 refills | Status: DC

## 2015-12-18 NOTE — Patient Instructions (Signed)
Drink plenty of fluids. Try and resume your regular medications if able to keep them down. Follow a bland diet--avoid all dairy. Use Mucinex DM, or Robitussin DM as needed for cough. Continue to use Aleve (only if eating--take with food) vs tylenol as needed for pain.  You have some muscular pain in the back--heating pad and massage may help.  Take the cipro twice daily to cover for bladder infection.  We are sending a urine culture and will contact you when the results are back. Return Monday for re-evaluation--come back sooner if higher fevers, shortness of breath, other worsening symptoms. You seem mildly dehydrated--really try and push the fluid intake (slowly).  If you are not voiding much, if mouth is very dry, if your pulse is consistently >100, if you are dizzy--these are all signs of dehydration, and go to the emergency room (for IV fluids and evaluation).

## 2015-12-18 NOTE — Progress Notes (Signed)
Chief Complaint  Patient presents with  . Cough    started with cough and runny nose last Thursday. Then Friday had chills and was so cold she couldn't get out of bed. Body aches and pain so bad Friday, wondered if she had kidney infection. Started vomiting and diarrhea Sat-stopped Sunday. Took an aleve yesterday and felt somewhat better. Today pain started again and headache and stomach. Has no appetite.    URI symptoms started 4 days ago. 3 days ago she developed chills.  She then developed right lower abdominal and right lower back pain. It was sore to move--getting up, down, moving around.  Vomiting started 2 days ago, along with diarrhea.  Decreased appetite.  She wasn't able to take her regular medications (she would throw them back up). She was having trouble keeping fluids down until yesterday--then able to keep down sprite, crackers.  She also took Aleve yesterday--helped with pain, aches, chills.  She took another Aleve this morning, because as she moved around, her pain recurred.  She denies any urinary complaints--no dysuria, no hematuria, incontinence, but based on location of her pain, was worried about a kidney infection. She has noticed an odor to the urine over the last 2 days.  She denies having any temperature above 100.4.  Today--she continues to cough, sneeze.  Nausea has improved.  Last episode of diarrhea was yesterday morning.  No bowel movement yet today.  PMH, PSH, SH reviewed  Outpatient Encounter Prescriptions as of 12/18/2015  Medication Sig Note  . naproxen sodium (ANAPROX) 220 MG tablet Take 220 mg by mouth 2 (two) times daily with a meal. 12/18/2015: Last dose 11:30am  . alendronate (FOSAMAX) 70 MG tablet TAKE 1 TABLET BY MOUTH EVERY 7 DAYS WITH A FULL GLASS OF WATER. TAKE ON AN EMPTY STOMACH (Patient not taking: Reported on 12/18/2015)   . allopurinol (ZYLOPRIM) 100 MG tablet Take 1 tablet (100 mg total) by mouth daily. (Patient not taking: Reported on 12/18/2015)   .  Calcium-Magnesium-Vitamin D (CITRACAL SLOW RELEASE PO) Take 1 tablet by mouth daily. 10/18/2015: Now taking Caltrate 1249m slow release, with Vitamin D  . ciprofloxacin (CIPRO) 500 MG tablet Take 1 tablet (500 mg total) by mouth 2 (two) times daily.   . Coenzyme Q10 (COQ10) 100 MG CAPS Take 100 mg by mouth daily.   . fenofibrate 54 MG tablet Take 1 tablet (54 mg total) by mouth daily. (Patient not taking: Reported on 12/18/2015)   . furosemide (LASIX) 20 MG tablet TAKE 1 TABLET(20 MG) BY MOUTH DAILY (Patient not taking: Reported on 12/18/2015)   . losartan (COZAAR) 25 MG tablet TAKE 1 TABLET(25 MG) BY MOUTH DAILY (Patient not taking: Reported on 12/18/2015)   . Omega-3 Fatty Acids (FISH OIL) 1200 MG CAPS Take 2 capsules by mouth daily. 04/28/2014: Taking 3 daily now  . omeprazole (PRILOSEC) 20 MG capsule Take 20 mg by mouth daily. 10/10/2014: Only takes prn  . potassium chloride SA (K-DUR,KLOR-CON) 20 MEQ tablet Take 1 tablet (20 mEq total) by mouth daily. (Patient not taking: Reported on 12/18/2015)   . rosuvastatin (CRESTOR) 20 MG tablet TAKE 1 TABLET(20 MG) BY MOUTH DAILY (Patient not taking: Reported on 12/18/2015)   . SYNTHROID 50 MCG tablet TAKE 1 TABLET (50 MCG) DAILY BEFORE BREAKFAST (Patient not taking: Reported on 12/18/2015)   . timolol (TIMOPTIC) 0.5 % ophthalmic solution INSTILL 1 DROP INTO BOTH EYES QAM UTD 04/13/2015: Received from: External Pharmacy  . Travoprost, BAK Free, (TRAVATAN) 0.004 % SOLN ophthalmic solution  Place 1 drop into both eyes at bedtime.   . triamcinolone cream (KENALOG) 0.1 % Apply 1 application topically 2 (two) times daily.  11/20/2015: Received from: External Pharmacy  . [DISCONTINUED] lisinopril (PRINIVIL,ZESTRIL) 10 MG tablet TAKE 1 TABLET BY MOUTH DAILY. INCREASE TO 2 TABLETS IN 2 WEEKS IF BLOOD PRESSURE STAYS GREATER THAN 140/90 (Patient not taking: Reported on 12/18/2015)    Facility-Administered Encounter Medications as of 12/18/2015  Medication  . influenza   inactive virus vaccine (FLUZONE/FLUARIX) injection 0.5 mL   She reports not taking ANY of her meds the last few days, other than Aleve as per HPI (cipro above was rx'd today, not prior to visit)  Allergies  Allergen Reactions  . Lisinopril Cough  . Penicillins Rash   ROS: low grade fever, URI symptoms, GI complaints and urinary odor as per HPI.  No bleeding, bruising, rash, headaches, dizziness, syncope, edema, chest pain or shortness of breath.  Cough is nonproductive  PHYSICAL EXAM: BP (!) 144/94   Pulse 100   Temp 98.5 F (36.9 C) (Tympanic)   Ht 5' 0.5" (1.537 m)   Wt 152 lb (68.9 kg)   BMI 29.20 kg/m  142/78 on repeat by MD  Well appearing, pleasant female, with frequent dry cough, speaking easily, in no distress HEENT: PERRL, EOMI, conjunctiva and sclera clear.  TM's and EAC's normal. Nares--mild inflammation of mucosa with dried blood on the left. No purulence. Sinuses nontender, OP is clear Neck: no lymphadenopathy, supple, no mass Heart: tachycardic, rate 100-105, no murmur Lungs: clear bilaterally Back: no spinal tenderness, no CVA tenderness Tender over lumbar paraspinous muscle on the right Abdomen: normal bowel sounds, soft. Tender at RUQ (but negative Murphy sign), extending to RLQ.  There is a small superficial ecchymosis on the RLQ.  No masses. No rebound tenderness or guarding Extremities: no edema Neuro: alert and oriented, cranial nerves intact, normal strength, gait Psych: normal mood, affect, hygiene and grooming   Urine dip: 3+ blood, Nitrite+, 2+ blood, SG 1.025  ASSESSMENT/PLAN:  Low back pain, unspecified back pain laterality, with sciatica presence unspecified - right sided--suspect muscular, related to coughing.  Nontender at CVA - Plan: POCT Urinalysis Dipstick  Abnormal urinalysis - c/w UTI--symptoms include odor only, but given recent diarrhea, will cover for UTI while culture pending. Push fluids/mildly dehydrated - Plan: Urine  culture  Dehydration - mild, related to vomiting ,diarrhea, decreased appetite.  Encouraged to push fluids - Plan: Comprehensive metabolic panel  Fever, unspecified fever cause - Plan: CBC with Differential/Platelet  Abdominal pain, right upper quadrant - Ddx reviewed--if worsening pain, persistent fevers may need further eval/imaging. Has f/u Monday--return sooner if worse - Plan: CBC with Differential/Platelet, Comprehensive metabolic panel  Urinary tract infection without hematuria, site unspecified - Plan: ciprofloxacin (CIPRO) 500 MG tablet  Essential hypertension, benign - BP mildly elevated today, hasn't taken meds in a few days.  restart losartan and f/u Monday, as scheduled  Viral syndrome - suspected based on URI and GI symptoms. supportive measures reviewed. Check labs to r/o more serious cause   Low grade fevers, URI symptoms, abnormal urine with no urinary symptoms other than odor, and R-sided abdominal pain.  Nausea, vomiting and diarrhea have subsided.  Suspect a viral illness. Check CBC, c-met  Urine culture.  No evidence of pneumonia on exam  Treat with cipro to cover UTI while awaiting the urine culture. If persistent right-sided abdominal pain and fever, may need further evaluation/imaging.  Declines nausea medications.  F/u Monday (already has  appt).   Drink plenty of fluids. Try and resume your regular medications if able to keep them down. Follow a bland diet--avoid all dairy. Use Mucinex DM, or Robitussin DM as needed for cough. Continue to use Aleve (only if eating--take with food) vs tylenol as needed for pain.  You have some muscular pain in the back--heating pad and massage may help.  Take the cipro twice daily to cover for bladder infection.  We are sending a urine culture and will contact you when the results are back. Return Monday for re-evaluation--come back sooner if higher fevers, shortness of breath, other worsening symptoms. You seem mildly  dehydrated--really try and push the fluid intake (slowly).  If you are not voiding much, if mouth is very dry, if your pulse is consistently >100, if you are dizzy--these are all signs of dehydration, and go to the emergency room (for IV fluids and evaluation).

## 2015-12-21 LAB — URINE CULTURE

## 2015-12-25 ENCOUNTER — Encounter: Payer: Self-pay | Admitting: Family Medicine

## 2015-12-25 ENCOUNTER — Ambulatory Visit (INDEPENDENT_AMBULATORY_CARE_PROVIDER_SITE_OTHER): Payer: Medicare Other | Admitting: Family Medicine

## 2015-12-25 VITALS — BP 140/80 | HR 80 | Temp 98.0°F | Ht 60.5 in | Wt 152.4 lb

## 2015-12-25 DIAGNOSIS — N3000 Acute cystitis without hematuria: Secondary | ICD-10-CM

## 2015-12-25 DIAGNOSIS — H8102 Meniere's disease, left ear: Secondary | ICD-10-CM | POA: Diagnosis not present

## 2015-12-25 DIAGNOSIS — I10 Essential (primary) hypertension: Secondary | ICD-10-CM | POA: Diagnosis not present

## 2015-12-25 DIAGNOSIS — R8290 Unspecified abnormal findings in urine: Secondary | ICD-10-CM | POA: Diagnosis not present

## 2015-12-25 LAB — POCT URINALYSIS DIPSTICK
BILIRUBIN UA: NEGATIVE
GLUCOSE UA: NEGATIVE
Ketones, UA: NEGATIVE
Leukocytes, UA: NEGATIVE
NITRITE UA: NEGATIVE
Protein, UA: NEGATIVE
RBC UA: NEGATIVE
Spec Grav, UA: 1.02
Urobilinogen, UA: NEGATIVE
pH, UA: 6.5

## 2015-12-25 NOTE — Patient Instructions (Signed)
  Restart the furosemide--this was prescribed for your Meniere's.  I wanted it held just temporarily while you were likely a little dehydrated from the GI illness (which has resolved).   For now, continue the 25mg  of losartan; restart the furosemide and monitor your blood pressure. Send me a message in about a week with your blood pressures.  If they are consistently 140's systolic, we will increase to the 50mg  dose, and remind me to send in a new prescription for the higher dose.  Goal blood pressure is <130-135/80-85.  Return in 3 months--bring your list of blood pressures, contacting us sooner with any questions or concerns. Keep a list of BP's with the following columns:  Date/morning/evening/comments (using the comments section for any outlyers or if you felt bad; ie high salt meal (ham), forgot med, had a headache, felt dizzy, etc etc).

## 2015-12-25 NOTE — Progress Notes (Signed)
Chief Complaint  Patient presents with  . Follow-up    on UTI and bp.    "I'm 200% better".  No further  Nausea, vomiting, diarrhea or URI symptoms. No fevers. She finished her cipro this morning, and denies any urinary symptoms. Urine culture showed >100K E.coli (sensitive to all ABX).  Follow-up hypertension: she was started on lisinopril 8/21, but was changed to losartan after developing a cough.  She has been taking 25mg .  She had been off all her medications during her illness. Went back on her other medications 3 days ago.  BP's have been running (prior to illness, at 25mg ) 140-145 mostly/80's, once or twice saw 120's.  She had been debating about increasing to the 50mg  dose due to BP's remaining borderline/elevated, but then she got sick.    PMH, PSH, SH reviewed  Outpatient Encounter Prescriptions as of 12/25/2015  Medication Sig Note  . alendronate (FOSAMAX) 70 MG tablet TAKE 1 TABLET BY MOUTH EVERY 7 DAYS WITH A FULL GLASS OF WATER. TAKE ON AN EMPTY STOMACH   . allopurinol (ZYLOPRIM) 100 MG tablet Take 1 tablet (100 mg total) by mouth daily.   . Calcium-Magnesium-Vitamin D (CITRACAL SLOW RELEASE PO) Take 1 tablet by mouth daily. 10/18/2015: Now taking Caltrate 1200mg  slow release, with Vitamin D  . Coenzyme Q10 (COQ10) 100 MG CAPS Take 100 mg by mouth daily.   . fenofibrate 54 MG tablet Take 1 tablet (54 mg total) by mouth daily.   Marland Kitchen. losartan (COZAAR) 25 MG tablet TAKE 1 TABLET(25 MG) BY MOUTH DAILY   . Omega-3 Fatty Acids (FISH OIL) 1200 MG CAPS Take 2 capsules by mouth daily. 04/28/2014: Taking 3 daily now  . omeprazole (PRILOSEC) 20 MG capsule Take 20 mg by mouth daily. 10/10/2014: Only takes prn  . potassium chloride SA (K-DUR,KLOR-CON) 20 MEQ tablet Take 1 tablet (20 mEq total) by mouth daily.   . rosuvastatin (CRESTOR) 20 MG tablet TAKE 1 TABLET(20 MG) BY MOUTH DAILY   . SYNTHROID 50 MCG tablet TAKE 1 TABLET (50 MCG) DAILY BEFORE BREAKFAST   . timolol (TIMOPTIC) 0.5 %  ophthalmic solution INSTILL 1 DROP INTO BOTH EYES QAM UTD 04/13/2015: Received from: External Pharmacy  . Travoprost, BAK Free, (TRAVATAN) 0.004 % SOLN ophthalmic solution Place 1 drop into both eyes at bedtime.   . furosemide (LASIX) 20 MG tablet TAKE 1 TABLET(20 MG) BY MOUTH DAILY (Patient not taking: Reported on 12/25/2015)   . triamcinolone cream (KENALOG) 0.1 % Apply 1 application topically 2 (two) times daily.  11/20/2015: Received from: External Pharmacy  . [DISCONTINUED] ciprofloxacin (CIPRO) 500 MG tablet Take 1 tablet (500 mg total) by mouth 2 (two) times daily.   . [DISCONTINUED] naproxen sodium (ANAPROX) 220 MG tablet Take 220 mg by mouth 2 (two) times daily with a meal. 12/18/2015: Last dose 11:30am   Facility-Administered Encounter Medications as of 12/25/2015  Medication  . influenza  inactive virus vaccine (FLUZONE/FLUARIX) injection 0.5 mL   Allergies  Allergen Reactions  . Lisinopril Cough  . Penicillins Rash   ROS: no fever, chills, nausea, vomiting, diarrhea, headache, dizziness, chest pain, shortness of breath, bleeding, bruising, urinary complaints. No complaints today.  PHYSICAL EXAM:  BP 140/80 (BP Location: Right Arm, Patient Position: Sitting, Cuff Size: Normal)   Pulse 80   Temp 98 F (36.7 C) (Tympanic)   Ht 5' 0.5" (1.537 m)   Wt 152 lb 6.4 oz (69.1 kg)   BMI 29.27 kg/m   Well appearing, pleasant female in no  distress HEENT: PERRL, EOMI, conjunctiva and sclera are clear, OP clear, moist mucus membranes Neck: no lymphadenopathy or mass Heart: regular rate and rhythm Lungs: clear bilaterally Back: no CVA tenderness Abdomen: soft, nontender, no mass Extremities: no edema Skin: normal turgor, no rash/lesions Psych: normal mood, affect, hygiene and grooming  Urine dip normal.  ASSESSMENT/PLAN:  Essential hypertension, benign - borderline BP's; continue 25mg , regular exercise, low sodium diet and restart lasix--if BP's consistently >140 systolic, to  increase to 50mg . f/u 3 mos  Meniere disease, left - restart furosemide  Acute cystitis without hematuria - resolved, repeat urine is normal  Abnormal urine - Plan: POCT Urinalysis Dipstick   Restart the furosemide--this was prescribed for your Meniere's.  I wanted it held just temporarily while you were likely a little dehydrated from the GI illness (which has resolved).   For now, continue the 25mg  of losartan; restart the furosemide and monitor your blood pressure. Send me a message in about a week with your blood pressures.  If they are consistently 140's systolic, we will increase to the 50mg  dose, and remind me to send in a new prescription for the higher dose.  Goal blood pressure is <130-135/80-85.  Return in 3 months--bring your list of blood pressures, contacting us sooner with any questions or concerns. Keep a list of BP's with the following columns:  Date/morning/evening/comments (using the comments section for any outlyers or if you felt bad; ie high salt meal (ham), forgot med, had a headache, felt dizzy, etc etc).

## 2016-01-17 ENCOUNTER — Other Ambulatory Visit (INDEPENDENT_AMBULATORY_CARE_PROVIDER_SITE_OTHER): Payer: Medicare Other

## 2016-01-17 DIAGNOSIS — Z23 Encounter for immunization: Secondary | ICD-10-CM | POA: Diagnosis not present

## 2016-01-19 ENCOUNTER — Other Ambulatory Visit: Payer: Self-pay | Admitting: Family Medicine

## 2016-02-05 ENCOUNTER — Other Ambulatory Visit: Payer: Self-pay | Admitting: *Deleted

## 2016-02-05 ENCOUNTER — Encounter: Payer: Self-pay | Admitting: Family Medicine

## 2016-02-05 MED ORDER — LOSARTAN POTASSIUM 50 MG PO TABS: 50.0000 mg | ORAL_TABLET | Freq: Every day | ORAL | 0 refills | Status: DC

## 2016-03-11 ENCOUNTER — Other Ambulatory Visit: Payer: Self-pay | Admitting: Family Medicine

## 2016-03-11 DIAGNOSIS — M8580 Other specified disorders of bone density and structure, unspecified site: Secondary | ICD-10-CM

## 2016-04-07 ENCOUNTER — Other Ambulatory Visit: Payer: Self-pay | Admitting: Family Medicine

## 2016-04-07 DIAGNOSIS — E782 Mixed hyperlipidemia: Secondary | ICD-10-CM

## 2016-04-11 ENCOUNTER — Other Ambulatory Visit: Payer: Self-pay | Admitting: Family Medicine

## 2016-04-11 DIAGNOSIS — E782 Mixed hyperlipidemia: Secondary | ICD-10-CM

## 2016-04-16 ENCOUNTER — Encounter: Payer: Self-pay | Admitting: Family Medicine

## 2016-04-16 ENCOUNTER — Other Ambulatory Visit: Payer: Self-pay | Admitting: Family Medicine

## 2016-04-16 DIAGNOSIS — E782 Mixed hyperlipidemia: Secondary | ICD-10-CM

## 2016-04-16 MED ORDER — ROSUVASTATIN CALCIUM 20 MG PO TABS: ORAL_TABLET | ORAL | 0 refills | Status: DC

## 2016-04-18 ENCOUNTER — Other Ambulatory Visit: Payer: Medicare Other

## 2016-04-22 ENCOUNTER — Other Ambulatory Visit: Payer: Medicare Other

## 2016-04-22 DIAGNOSIS — R7301 Impaired fasting glucose: Secondary | ICD-10-CM

## 2016-04-22 DIAGNOSIS — I10 Essential (primary) hypertension: Secondary | ICD-10-CM

## 2016-04-22 DIAGNOSIS — Z5181 Encounter for therapeutic drug level monitoring: Secondary | ICD-10-CM

## 2016-04-22 DIAGNOSIS — E782 Mixed hyperlipidemia: Secondary | ICD-10-CM

## 2016-04-22 LAB — LIPID PANEL
Cholesterol: 146 mg/dL (ref <200)
HDL: 34 mg/dL — ABNORMAL LOW (ref >50)
LDL CALC: 80 mg/dL (ref <100)
TRIGLYCERIDES: 161 mg/dL — AB (ref <150)
Total CHOL/HDL Ratio: 4.3 Ratio (ref <5.0)
VLDL: 32 mg/dL — ABNORMAL HIGH (ref <30)

## 2016-04-22 LAB — COMPREHENSIVE METABOLIC PANEL
ALK PHOS: 27 U/L — AB (ref 33–130)
ALT: 25 U/L (ref 6–29)
AST: 21 U/L (ref 10–35)
Albumin: 4 g/dL (ref 3.6–5.1)
BUN: 18 mg/dL (ref 7–25)
CALCIUM: 10 mg/dL (ref 8.6–10.4)
CO2: 24 mmol/L (ref 20–31)
Chloride: 105 mmol/L (ref 98–110)
Creat: 0.9 mg/dL (ref 0.60–0.93)
Glucose, Bld: 104 mg/dL — ABNORMAL HIGH (ref 65–99)
POTASSIUM: 4 mmol/L (ref 3.5–5.3)
Sodium: 141 mmol/L (ref 135–146)
TOTAL PROTEIN: 6.7 g/dL (ref 6.1–8.1)
Total Bilirubin: 0.4 mg/dL (ref 0.2–1.2)

## 2016-04-22 LAB — HEMOGLOBIN A1C
HEMOGLOBIN A1C: 5.2 % (ref <5.7)
Mean Plasma Glucose: 103 mg/dL

## 2016-04-23 NOTE — Progress Notes (Signed)
Chief Complaint  Patient presents with  . Hypertension    nonfasting med check (labs already done).    Hypertension follow-up:  At her last visit in September, her BP's had been borderline on 25mg  of losartan.  We suggested further observation with  regular exercise, low sodium diet and restart lasix (for Meniere's)--if BP's consistently >140 systolic, to increase to 50mg . She increased to 50mg  in October (10/13).  BP's have been running 130's/80's, max 140/90. (didn't bring list with her today) She had been walking up until holidays/travel, but not much exercise over the last month.  Mixed hyperlipidemia: She has been trying to follow a lowfat diet. She only rarely eats fried foods.Denies any significant change in diet over the holidays (just eating out, traveling).  She admits her downfall is pastas, breads (with butter), but she cut back on this some. She reports compliance with her medications (Crestor, fenofibrate and fish oil), and no side effects. She is taking 3 fish oil daily.She had labs prior to today's visit--see below.  Meniere's disease--She is back on furosemide (and potassium) without side effects or muscle cramps. Hasn't had any flares.  She has decreased hearing in the left ear (chronic, unchanged). She hasn't seen ENT since moving here--plans to, but hasn't had time. Still needs to do this.  Gout--no flare in a few years. No side effects to allopurinol.  GERD: She is taking the prilosec now just as needed, just 1-2x/month. Denies any cough with eating, which had been her manifestation of reflux (not heartburn). Denies dysphagia.  Hypothyroidism: She is compliant with taking her Synthroid on empty stomach, separate from other medications. She never really had any symptoms related to her thyroid. She denies any changes in hair/skin/bowels/moods.  Prediabetes--A1c had been up to 5.9 in 04/2015. She has tried to cut back on carbs, and walk more. Doesn't drink regular  soda, sweet tea.  Uses TAC cream to external vaginal area, for dry and itchy skin. She got this from the dermatologist in May/June/July sometime.  She is asking for refill. She uses it about every other day. She was told "skin was inflamed", not given a diagnosis of lichen sclerosis, planus or other diagnosis. OTC HC cream wasn't effective when she tried it prior to seeing the dermatologist  PMH, PSH, SH reviewed  Outpatient Encounter Prescriptions as of 04/24/2016  Medication Sig Note  . alendronate (FOSAMAX) 70 MG tablet TAKE 1 TABLET BY MOUTH EVERY 7 DAYS WITH A FULL GLASS OF WATER AND ON AN EMPTY STOMACH   . allopurinol (ZYLOPRIM) 100 MG tablet Take 1 tablet (100 mg total) by mouth daily.   . Calcium-Magnesium-Vitamin D (CITRACAL SLOW RELEASE PO) Take 1 tablet by mouth daily. 10/18/2015: Now taking Caltrate 1200mg  slow release, with Vitamin D  . Coenzyme Q10 (COQ10) 100 MG CAPS Take 100 mg by mouth daily.   . fenofibrate 54 MG tablet TAKE 1 TABLET(54 MG) BY MOUTH DAILY   . furosemide (LASIX) 20 MG tablet TAKE 1 TABLET(20 MG) BY MOUTH DAILY   . losartan (COZAAR) 50 MG tablet Take 1 tablet (50 mg total) by mouth daily.   . Omega-3 Fatty Acids (FISH OIL) 1200 MG CAPS Take 2 capsules by mouth daily. 04/28/2014: Taking 3 daily now  . potassium chloride SA (K-DUR,KLOR-CON) 20 MEQ tablet Take 1 tablet (20 mEq total) by mouth daily.   . rosuvastatin (CRESTOR) 20 MG tablet TAKE 1 TABLET(20 MG) BY MOUTH DAILY   . SYNTHROID 50 MCG tablet TAKE 1 TABLET (50 MCG)  DAILY BEFORE BREAKFAST   . timolol (TIMOPTIC) 0.5 % ophthalmic solution INSTILL 1 DROP INTO BOTH EYES QAM UTD 04/13/2015: Received from: External Pharmacy  . Travoprost, BAK Free, (TRAVATAN) 0.004 % SOLN ophthalmic solution Place 1 drop into both eyes at bedtime.   . triamcinolone cream (KENALOG) 0.1 % Apply 1 application topically 2 (two) times daily.  11/20/2015: Received from: External Pharmacy  . [DISCONTINUED] losartan (COZAAR) 50 MG tablet  Take 1 tablet (50 mg total) by mouth daily.   . hydrocortisone 2.5 % cream Apply to affected areas of skin twice daily as needed for rash/itching   . omeprazole (PRILOSEC) 20 MG capsule Take 20 mg by mouth daily. 10/10/2014: Only takes prn   Facility-Administered Encounter Medications as of 04/24/2016  Medication  . influenza  inactive virus vaccine (FLUZONE/FLUARIX) injection 0.5 mL   (HC 2.5% rx'd today, not taking prior to visit)  Allergies  Allergen Reactions  . Lisinopril Cough  . Penicillins Rash     ROS: No fever, chills, headaches, dizziness, URI symptoms, cough, shortness of breath, chest pain, palpitations, edema, bleeding, bruising, rashes (other than the vaginal irritation), urinary complaints. Moods are good  PHYSICAL EXAM:  BP (!) 142/88 (BP Location: Left Arm, Patient Position: Sitting, Cuff Size: Normal)   Pulse 68   Ht 5' 0.5" (1.537 m)   Wt 157 lb (71.2 kg)   BMI 30.16 kg/m   138/70 on repeat by MD  Well appearing, pleasant female in no distress Neck: no lymphadenopathy or mass, no carotid bruit Heart: regular rate and rhythm Lungs: clear bilaterally Back: no CVA tenderness Abdomen: soft, nontender, no mass Extremities: no edema, normal pulses Skin: normal turgor, no rash/lesions Psych: normal mood, affect, hygiene and grooming    Chemistry      Component Value Date/Time   NA 141 04/22/2016 1029   K 4.0 04/22/2016 1029   CL 105 04/22/2016 1029   CO2 24 04/22/2016 1029   BUN 18 04/22/2016 1029   CREATININE 0.90 04/22/2016 1029      Component Value Date/Time   CALCIUM 10.0 04/22/2016 1029   ALKPHOS 27 (L) 04/22/2016 1029   AST 21 04/22/2016 1029   ALT 25 04/22/2016 1029   BILITOT 0.4 04/22/2016 1029     Fasting glu 104  Lab Results  Component Value Date   HGBA1C 5.2 04/22/2016   Lab Results  Component Value Date   CHOL 146 04/22/2016   HDL 34 (L) 04/22/2016   LDLCALC 80 04/22/2016   TRIG 161 (H) 04/22/2016   CHOLHDL 4.3 04/22/2016     ASSESSMENT/PLAN:  Essential hypertension, benign - borderline control per home values; reviewed daily exercise, low sodium diet, goals; continue current med; f/u sooner than 6 mos if remain high - Plan: losartan (COZAAR) 50 MG tablet  Mixed hyperlipidemia - lowfat, low cholesterol diet reviewed. Continue meds; increase exercise  Meniere's disease of left ear - controlled; she plans to see ENT at some point, discussed ENT's/recommendations  Impaired fasting glucose - A1c improved; encouraged proper diet, daily exercise, weight loss  Dermatitis - vaginal; trying to taper down to weaker steroid for more regular use, TAC prn/sparingly - Plan: hydrocortisone 2.5 % cream   Change to HC 2.5% from TAC as a trial.  F/u AWV 6 months with fasting labs prior.

## 2016-04-24 ENCOUNTER — Ambulatory Visit (INDEPENDENT_AMBULATORY_CARE_PROVIDER_SITE_OTHER): Payer: Medicare Other | Admitting: Family Medicine

## 2016-04-24 ENCOUNTER — Encounter: Payer: Self-pay | Admitting: Family Medicine

## 2016-04-24 VITALS — BP 138/70 | HR 68 | Ht 60.5 in | Wt 157.0 lb

## 2016-04-24 DIAGNOSIS — E782 Mixed hyperlipidemia: Secondary | ICD-10-CM | POA: Diagnosis not present

## 2016-04-24 DIAGNOSIS — E039 Hypothyroidism, unspecified: Secondary | ICD-10-CM | POA: Diagnosis not present

## 2016-04-24 DIAGNOSIS — Z5181 Encounter for therapeutic drug level monitoring: Secondary | ICD-10-CM

## 2016-04-24 DIAGNOSIS — R7301 Impaired fasting glucose: Secondary | ICD-10-CM

## 2016-04-24 DIAGNOSIS — M1A9XX0 Chronic gout, unspecified, without tophus (tophi): Secondary | ICD-10-CM

## 2016-04-24 DIAGNOSIS — L309 Dermatitis, unspecified: Secondary | ICD-10-CM

## 2016-04-24 DIAGNOSIS — I10 Essential (primary) hypertension: Secondary | ICD-10-CM

## 2016-04-24 DIAGNOSIS — H8102 Meniere's disease, left ear: Secondary | ICD-10-CM

## 2016-04-24 MED ORDER — LOSARTAN POTASSIUM 50 MG PO TABS: 50.0000 mg | ORAL_TABLET | Freq: Every day | ORAL | 1 refills | Status: DC

## 2016-04-24 MED ORDER — HYDROCORTISONE 2.5 % EX CREA: TOPICAL_CREAM | CUTANEOUS | 0 refills | Status: DC

## 2016-04-24 NOTE — Patient Instructions (Addendum)
Continue your current medications and monitoring your blood pressures. Bring your list to your next appointment.  Return sooner if BP is consistently >140/90.  Goal is to be under 130/80. Hopefully these numbers will stay in the 130 range once you resume more regular exercise. Try and lose some weight at the waist.  Use the hydrocortisone up to twice daily as needed for rash and itching. If this isn't effective, we can refill the triamcinolone--that is a stronger steroid that needs to be used to the vaginal area sparingly, not daily.  I recommend getting the new shingles vaccine (Shingrix) when available. You will need to check with your insurance to see if it is covered, and if covered by Medicare Part D, you need to get from the pharmacy rather than our office.  It is a series of 2 injections, spaced 2 months apart.

## 2016-05-04 ENCOUNTER — Other Ambulatory Visit: Payer: Self-pay | Admitting: Family Medicine

## 2016-05-04 DIAGNOSIS — H8102 Meniere's disease, left ear: Secondary | ICD-10-CM

## 2016-06-03 ENCOUNTER — Other Ambulatory Visit: Payer: Self-pay | Admitting: Family Medicine

## 2016-06-03 DIAGNOSIS — M8580 Other specified disorders of bone density and structure, unspecified site: Secondary | ICD-10-CM

## 2016-07-04 ENCOUNTER — Other Ambulatory Visit: Payer: Self-pay | Admitting: Medical

## 2016-07-04 DIAGNOSIS — E782 Mixed hyperlipidemia: Secondary | ICD-10-CM

## 2016-07-12 ENCOUNTER — Other Ambulatory Visit: Payer: Self-pay | Admitting: Family Medicine

## 2016-07-12 DIAGNOSIS — E782 Mixed hyperlipidemia: Secondary | ICD-10-CM

## 2016-09-10 ENCOUNTER — Other Ambulatory Visit: Payer: Self-pay | Admitting: Family Medicine

## 2016-09-10 DIAGNOSIS — M8580 Other specified disorders of bone density and structure, unspecified site: Secondary | ICD-10-CM

## 2016-09-10 NOTE — Telephone Encounter (Signed)
Is this okay to refill? Pt has an appt in august with you

## 2016-10-02 ENCOUNTER — Other Ambulatory Visit: Payer: Self-pay | Admitting: Family Medicine

## 2016-10-02 DIAGNOSIS — E782 Mixed hyperlipidemia: Secondary | ICD-10-CM

## 2016-10-09 ENCOUNTER — Other Ambulatory Visit: Payer: Self-pay | Admitting: Family Medicine

## 2016-10-09 DIAGNOSIS — E782 Mixed hyperlipidemia: Secondary | ICD-10-CM

## 2016-10-28 ENCOUNTER — Other Ambulatory Visit: Payer: Self-pay | Admitting: Family Medicine

## 2016-10-28 DIAGNOSIS — Z1231 Encounter for screening mammogram for malignant neoplasm of breast: Secondary | ICD-10-CM

## 2016-11-08 ENCOUNTER — Other Ambulatory Visit: Payer: Self-pay | Admitting: Family Medicine

## 2016-11-08 NOTE — Telephone Encounter (Signed)
She was supposed to have labs done prior to her visit--hasn't had them yet.  See if she has enough.  I prefer to wait until I see her TSH results before refilling, but don't want her to run out

## 2016-11-08 NOTE — Telephone Encounter (Signed)
Is this okay to refill? Last filled was 12/2015. Pt has upcoming appt next week

## 2016-11-08 NOTE — Telephone Encounter (Signed)
Pt has plenty of meds and can wait until next week at appt. Will deny med

## 2016-11-11 NOTE — Progress Notes (Signed)
Chief Complaint  Patient presents with  . Medicare Wellness    nonfasting AWV/CPE with pelvic. Just had eye exam 2 weeks ago. Not having any issues-brought in BP log.     Kaitlyn Abbott is a 76 y.o. female who presents for annual physical exam, Medicare wellness visit and follow-up on chronic medical conditions.   Hypertension follow-up:  BP's have been running 109-143/73-95, mostly 120's-130's/80.  She reports compliance with medications, follow low sodium diet.  Denies headaches, dizziness, chest pain.  Mixed hyperlipidemia: She has been trying to follow a lowfat diet. She only rarely eats fried foods.  She admits her downfall is pastas, breads (with butter), but she has cut back on this. She reports compliance with her medications (Crestor, fenofibrate and fish oil), and no side effects. She is taking 3 fish oil daily (2 in am, 1 at night).  Meniere's disease--She is taking furosemide (and potassium) without side effects or muscle cramps. Hasn't had any flares of vertigo.  She has decreased hearing in the left ear (chronic, unchanged). She hasn't seen ENT since moving here--scheduled for early September with Dr. Benjamine Mola.  Gout--no flare in a few years. No side effects to allopurinol.  GERD: She is taking the prilosec now just as needed, has taken it just once in the last 3 weeks.  Denies any cough with eating, which had been her manifestation of reflux (not heartburn). Denies dysphagia.  Hypothyroidism: She is compliant with taking her Synthroid on empty stomach, separate from other medications. She never really had any symptoms related to her thyroid. She denies any changes in hair/skin/bowels/moods.  Prediabetes--A1c had been up to 5.9 in 04/2015. Last check was normal at 5.2 in 04/2016.  She has tried to cut back on carbs, and walk more (3-4 times/week). Doesn't drink regular soda, sweet tea.  Previously used TAC cream to external vaginal area, prn for dry and itchy skin, as  prescribed by dermatologist. She was told "skin was inflamed", not given a diagnosis of lichen sclerosis, planus or other diagnosis. OTC HC cream wasn't effective when she tried it prior to seeing the dermatologist.  We changed to trial of 2.5% HC cream last January. This seems to be as effective as the TAC.  Needs to use it 2x/week.  Still has some left.    Immunization History  Administered Date(s) Administered  . Influenza Split 11/29/2010, 01/14/2012  . Influenza, High Dose Seasonal PF 01/26/2013, 12/30/2013, 01/12/2015, 01/17/2016  . Pneumococcal Conjugate-13 10/06/2013  . Pneumococcal Polysaccharide-23 01/24/2010  . Tdap 03/23/2012  . Zoster 04/21/2015   Last Pap smear: 09/2015, normal, with no high risk HPV Last mammogram:10/2015, scheduled for September Last colonoscopy: 2009. Denies any h/o polyps  Last DEXA: 09/2015, T-1.7.  No significant change from 09/2013; on alendronate since 2013. Dentist: regularly, twice yearly Ophtho: every 6 months Exercise: walks 3-4 times/week, 2.5 miles; uses handweights most evenings. AAA screen negative 09/2015  Other doctors caring for patient include: Ophtho: Dr. Laban Emperor at [facility] Dentist: Dr. Imagene Riches Periodontist: Dr. Satira Sark Ortho: Dr. Noemi Chapel GI: None locally, seen in [place] (will use Dr. Watt Climes when due again) Derm: Dr. Derrel Nip ([facility]) Podiatrist: Dr. Paulla Dolly ENT: scheduled to see Dr. Benjamine Mola 12/04/16  Depression screen:Negative  Functional Status survey:  Notable for hearing loss in L ear, unchanged (related to meniere's).  Fall screen is negative. See full screens in Epic  End of Life Discussion: Patient has a living will and medical power of attorney  Past Medical History:  Diagnosis Date  .  Gout attack 2011  . Hypertension   . Hypothyroid   . Impaired fasting glucose   . Low tension glaucoma    [facility]  . Meniere disease   . Mixed hyperlipidemia   . Osteopenia   . Vitamin D deficiency     Past Surgical  History:  Procedure Laterality Date  . GANGLION CYST EXCISION  bilateral, 20 years apart   bilateral wrists  . ROTATOR CUFF REPAIR  10/2010   Right (Dr. Noemi Chapel)    Social History   Social History  . Marital status: Married    Spouse name: N/A  . Number of children: 2  . Years of education: N/A   Occupational History  . retired Surveyor, quantity, Engineer, petroleum)    Social History Main Topics  . Smoking status: Never Smoker  . Smokeless tobacco: Never Used  . Alcohol use Yes     Comment: 3 drinks a month and occasion glass of wine.  . Drug use: No  . Sexual activity: Yes    Partners: Male   Other Topics Concern  . Not on file   Social History Narrative   Lives with husband.  Daughter lives in [place], son moved from [place] to [place], [place].  No pets.  Daughter is IT consultant. She chauffeurs her granddaughters around (stays with them if their parents are OOT)    Family History  Problem Relation Age of Onset  . Diabetes Mother   . Hyperlipidemia Mother   . Hypertension Mother   . Arthritis Mother   . Hypothyroidism Mother   . Aortic aneurysm Mother 51       repaired after it started leaking; thoracic (and small renal)  . Cancer Father        kidney CA, and bone CA  . Anxiety disorder Sister   . Hyperlipidemia Sister   . Hypertension Sister   . Alcohol abuse Sister   . Hyperlipidemia Daughter   . Crohn's disease Daughter   . Hyperlipidemia Son   . Arthritis Maternal Grandmother   . Heart disease Maternal Grandmother   . Diabetes Maternal Grandfather   . Heart disease Maternal Grandfather   . Stroke Maternal Grandfather     Outpatient Encounter Prescriptions as of 11/14/2016  Medication Sig Note  . alendronate (FOSAMAX) 70 MG tablet TAKE 1 TABLET BY MOUTH EVERY 7 DAYS WITH A FULL GLASS OF WATER AND ON AN EMPTY STOMACH   . allopurinol (ZYLOPRIM) 100 MG tablet Take 1 tablet (100 mg total) by mouth daily.   . Calcium-Magnesium-Vitamin D (CITRACAL SLOW RELEASE PO)  Take 1 tablet by mouth daily. 10/18/2015: Now taking Caltrate '1200mg'$  slow release, with Vitamin D  . Coenzyme Q10 (COQ10) 100 MG CAPS Take 100 mg by mouth daily.   . fenofibrate 54 MG tablet TAKE 1 TABLET(54 MG) BY MOUTH DAILY   . furosemide (LASIX) 20 MG tablet TAKE 1 TABLET(20 MG) BY MOUTH DAILY   . hydrocortisone 2.5 % cream Apply to affected areas of skin twice daily as needed for rash/itching 11/14/2016: Uses to vaginal area 2x/week prn  . losartan (COZAAR) 50 MG tablet Take 1 tablet (50 mg total) by mouth daily.   . Omega-3 Fatty Acids (FISH OIL) 1200 MG CAPS Take 2 capsules by mouth daily. 04/28/2014: Taking 3 daily now  . potassium chloride SA (K-DUR,KLOR-CON) 20 MEQ tablet TAKE 1 TABLET(20 MEQ) BY MOUTH DAILY   . rosuvastatin (CRESTOR) 20 MG tablet TAKE 1 TABLET(20 MG) BY MOUTH DAILY   .  SYNTHROID 50 MCG tablet TAKE 1 TABLET (50 MCG) DAILY BEFORE BREAKFAST   . timolol (TIMOPTIC) 0.5 % ophthalmic solution INSTILL 1 DROP INTO BOTH EYES QAM UTD 04/13/2015: Received from: External Pharmacy  . Travoprost, BAK Free, (TRAVATAN) 0.004 % SOLN ophthalmic solution Place 1 drop into both eyes at bedtime.   Marland Kitchen omeprazole (PRILOSEC) 20 MG capsule Take 20 mg by mouth daily. 10/10/2014: Only takes prn  . [DISCONTINUED] triamcinolone cream (KENALOG) 0.1 % Apply 1 application topically 2 (two) times daily.  11/20/2015: Received from: External Pharmacy   Facility-Administered Encounter Medications as of 11/14/2016  Medication  . influenza  inactive virus vaccine (FLUZONE/FLUARIX) injection 0.5 mL    Allergies  Allergen Reactions  . Lisinopril Cough  . Penicillins Rash    ROS: The patient denies anorexia, fever, headaches; vision changes, decreased hearing (diminished on left, chronic/unchanged), ear pain, sore throat, breast concerns, chest pain, palpitations, dizziness, syncope, dyspnea on exertion, cough, swelling, nausea, vomiting, diarrhea, constipation, abdominal pain, melena, hematochezia,  indigestion/heartburn (rare, about once a month), hematuria, incontinence, dysuria, vaginal bleeding, discharge, or odor; slight external vaginal itch is controlled with hydrocortisone cream 2x/week prn; denies genital lesions, numbness, tingling, weakness, tremor, depression, anxiety, abnormal bleeding/bruising, or enlarged lymph nodes.  +arthritis in her hands usually just first thing in the morning has some stiffness (doesn't require meds, does better after running them in hot water); no other joint pains. Blue emu cream helps for her hand pain. No gout flares. +allergies flaring today (sneezing) Some intentional weight loss (slight)   PHYSICAL EXAM:  BP 136/80 (BP Location: Left Arm, Patient Position: Sitting, Cuff Size: Normal)   Pulse 80   Ht 5' 0.5" (1.537 m)   Wt 154 lb (69.9 kg)   BMI 29.58 kg/m    124/76 on repeat by MD   Wt Readings from Last 3 Encounters:  11/14/16 154 lb (69.9 kg)  04/24/16 157 lb (71.2 kg)  12/25/15 152 lb 6.4 oz (69.1 kg)    General Appearance:  Alert, cooperative, no distress, appears stated age   Head:  Normocephalic, without obvious abnormality, atraumatic   Eyes:  PERRL, conjunctiva/corneas clear, EOM's intact, fundi benign   Ears:  Normal TM's and external ear canals   Nose:  Nares normal, mucosa is mildly edematous on the right, no drainage or sinus tenderness   Throat:  Lips, mucosa, and tongue normal; teeth and gums normal   Neck:  Supple, no lymphadenopathy; thyroid: no enlargement/tenderness/nodules; no carotid bruit or JVD   Back:  Spine nontender, no curvature, ROM normal, no CVA tenderness   Lungs:  Clear to auscultation bilaterally without wheezes, rales or ronchi; respirations unlabored   Chest Wall:  No tenderness or deformity   Heart:  Regular rate and rhythm, S1 and S2 normal, no murmur, rub  or gallop   Breast Exam:  No tenderness, masses, or nipple discharge or inversion. No  axillary lymphadenopathy   Abdomen:  Soft, non-tender, nondistended, normoactive bowel sounds,  no masses, no hepatosplenomegaly   Genitalia:  Normal external genitalia without lesions. Mild atrophic changes, and some thickening/infllammation (mild) of external genitalia/labia majora noted.  BUS and vagina normal; no  cervical motion tenderness. No abnormal vaginal discharge. Uterus and adnexa not enlarged, nontender, no masses. Pap not performed   Rectal:  Normal tone, no masses or tenderness; guaiac negative stool   Extremities:  No clubbing, cyanosis or edema.   Pulses:  2+ and symmetric all extremities   Skin:  Skin color, texture, turgor normal,  no rashes.   Lymph nodes:  Cervical, supraclavicular, and axillary nodes normal   Neurologic:  CNII-XII intact, normal strength, sensation and gait; reflexes 2+ and symmetric throughout    Psych:    Normal mood, affect, hygiene and grooming  Lab Results  Component Value Date   HGBA1C 5.3 11/12/2016     Chemistry      Component Value Date/Time   NA 138 11/12/2016 0920   K 4.2 11/12/2016 0920   CL 104 11/12/2016 0920   CO2 23 11/12/2016 0920   BUN 27 (H) 11/12/2016 0920   CREATININE 0.95 (H) 11/12/2016 0920      Component Value Date/Time   CALCIUM 9.4 11/12/2016 0920   ALKPHOS 27 (L) 11/12/2016 0920   AST 22 11/12/2016 0920   ALT 24 11/12/2016 0920   BILITOT 0.5 11/12/2016 0920     Lab Results  Component Value Date   TSH 4.04 11/12/2016   Lab Results  Component Value Date   WBC 5.8 11/12/2016   HGB 13.3 11/12/2016   HCT 40.4 11/12/2016   MCV 91.8 11/12/2016   PLT 209 11/12/2016   Lab Results  Component Value Date   LABURIC 6.4 11/12/2016   Lab Results  Component Value Date   CHOL 152 11/12/2016   HDL 35 (L) 11/12/2016   LDLCALC 80 11/12/2016   TRIG 183 (H) 11/12/2016   CHOLHDL 4.3 11/12/2016     ASSESSMENT/PLAN:  Annual physical exam - Plan: POCT Urinalysis  DIP (Proadvantage Device)  Medicare annual wellness visit, subsequent  Essential hypertension, benign - controlled - Plan: losartan (COZAAR) 50 MG tablet  Mixed hyperlipidemia - TG elevated, low HDL, high ratio. Discussed diet, increase fish oil to 4/d; if not better, increase crestor dose. recheck 6 mos  Impaired fasting glucose - improved; Continue low sugar/carb diet. Encouraged weight loss, continue regular exercise  Hypothyroidism, unspecified type - borderline TSH, no symptoms. Continue current dose. Recheck in 6 mos, sooner if symptoms (reviewed)  Chronic gout without tophus, unspecified cause, unspecified site - uric acid level slightly higher, but no flares. low purine diet, continue current allopurinol dose  Osteopenia, unspecified location - continue alendronate. recheck DEXA next year  Essential hypertension, benign - Plan: losartan (COZAAR) 50 MG tablet  Meniere disease, left - stable on current regimen without any vertigo; stable hearing loss. F/u as planned with ENT, Dr. Benjamine Mola - Plan: furosemide (LASIX) 20 MG tablet   Discussed monthly self breast exams and yearly mammograms; at least 30 minutes of aerobic activity at least 5 days/week, weight-bearing exercise at least 2x/wk; proper sunscreen use reviewed; healthy diet, including goals of calcium and vitamin D intake and alcohol recommendations (less than or equal to 1 drink/day) reviewed; regular seatbelt use; changing batteries in smoke detectors. Immunization recommendations discussed--continue yearly high dose flu shots. Shingrix recommended when more readily available. Colonoscopy recommendations reviewed--due in 2019. Discussed colonoscopy vs Cologard for screening DEXA due again 09/2017. Consider stopping alendronate if improving/stable.  MOST form reviewed and updated. Full code, Full Care  F/u 6 months with fasting labs prior Lipids, TSH, c-met  Increase your fish oil to 4/ day (2 capsules twice daily). Continue  to try and limit your sweets, sugars, junk, fried foods. Continue the 2m rosuvastatin and the fenofibrate.  If your numbers remain off, we might bump up the statin at your next check.  Your thyroid test was borderline.  We aren't changing your dose, but please contact [place] recheck the test if you notice increasing fatigue,  weight gain, hair loss, constipation, feeling cold, worsening dry skin or other problems.  Your uric acid level was a little higher than in the past. Since you aren't having any gout flares, I'm not going to adjust your allopurinol dose.  Be careful with your diet, and let us know if you start having more problems with gouty pain.  Medicare Attestation I have personally reviewed: The patient's medical and social history Their use of alcohol, tobacco or illicit drugs Their current medications and supplements The patient's functional ability including ADLs,fall risks, home safety risks, cognitive, and hearing and visual impairment Diet and physical activities Evidence for depression or mood disorders  The patient's weight, height, and BMI have been recorded in the chart.  I have made referrals, counseling, and provided education to the patient based on review of the above and I have provided the patient with a written personalized care plan for preventive services.

## 2016-11-12 ENCOUNTER — Other Ambulatory Visit: Payer: Medicare Other

## 2016-11-12 DIAGNOSIS — I10 Essential (primary) hypertension: Secondary | ICD-10-CM

## 2016-11-12 DIAGNOSIS — R7301 Impaired fasting glucose: Secondary | ICD-10-CM

## 2016-11-12 DIAGNOSIS — Z5181 Encounter for therapeutic drug level monitoring: Secondary | ICD-10-CM

## 2016-11-12 DIAGNOSIS — E039 Hypothyroidism, unspecified: Secondary | ICD-10-CM

## 2016-11-12 DIAGNOSIS — M1A9XX0 Chronic gout, unspecified, without tophus (tophi): Secondary | ICD-10-CM

## 2016-11-12 DIAGNOSIS — E782 Mixed hyperlipidemia: Secondary | ICD-10-CM

## 2016-11-12 LAB — CBC WITH DIFFERENTIAL/PLATELET
BASOS PCT: 1 %
Basophils Absolute: 58 cells/uL (ref 0–200)
EOS PCT: 7 %
Eosinophils Absolute: 406 cells/uL (ref 15–500)
HCT: 40.4 % (ref 35.0–45.0)
Hemoglobin: 13.3 g/dL (ref 11.7–15.5)
Lymphocytes Relative: 38 %
Lymphs Abs: 2204 cells/uL (ref 850–3900)
MCH: 30.2 pg (ref 27.0–33.0)
MCHC: 32.9 g/dL (ref 32.0–36.0)
MCV: 91.8 fL (ref 80.0–100.0)
MONOS PCT: 8 %
MPV: 10 fL (ref 7.5–12.5)
Monocytes Absolute: 464 cells/uL (ref 200–950)
NEUTROS ABS: 2668 {cells}/uL (ref 1500–7800)
Neutrophils Relative %: 46 %
PLATELETS: 209 10*3/uL (ref 140–400)
RBC: 4.4 MIL/uL (ref 3.80–5.10)
RDW: 13.7 % (ref 11.0–15.0)
WBC: 5.8 10*3/uL (ref 4.0–10.5)

## 2016-11-12 LAB — TSH: TSH: 4.04 mIU/L

## 2016-11-13 LAB — HEMOGLOBIN A1C
HEMOGLOBIN A1C: 5.3 % (ref <5.7)
MEAN PLASMA GLUCOSE: 105 mg/dL

## 2016-11-13 LAB — COMPREHENSIVE METABOLIC PANEL
ALT: 24 U/L (ref 6–29)
AST: 22 U/L (ref 10–35)
Albumin: 4.1 g/dL (ref 3.6–5.1)
Alkaline Phosphatase: 27 U/L — ABNORMAL LOW (ref 33–130)
BUN: 27 mg/dL — AB (ref 7–25)
CALCIUM: 9.4 mg/dL (ref 8.6–10.4)
CO2: 23 mmol/L (ref 20–32)
Chloride: 104 mmol/L (ref 98–110)
Creat: 0.95 mg/dL — ABNORMAL HIGH (ref 0.60–0.93)
Glucose, Bld: 100 mg/dL — ABNORMAL HIGH (ref 65–99)
POTASSIUM: 4.2 mmol/L (ref 3.5–5.3)
Sodium: 138 mmol/L (ref 135–146)
TOTAL PROTEIN: 6.4 g/dL (ref 6.1–8.1)
Total Bilirubin: 0.5 mg/dL (ref 0.2–1.2)

## 2016-11-13 LAB — LIPID PANEL
CHOL/HDL RATIO: 4.3 ratio (ref <5.0)
CHOLESTEROL: 152 mg/dL (ref <200)
HDL: 35 mg/dL — AB (ref >50)
LDL Cholesterol: 80 mg/dL (ref <100)
TRIGLYCERIDES: 183 mg/dL — AB (ref <150)
VLDL: 37 mg/dL — ABNORMAL HIGH (ref <30)

## 2016-11-13 LAB — URIC ACID: URIC ACID, SERUM: 6.4 mg/dL (ref 2.5–7.0)

## 2016-11-14 ENCOUNTER — Encounter: Payer: Self-pay | Admitting: Family Medicine

## 2016-11-14 ENCOUNTER — Ambulatory Visit (INDEPENDENT_AMBULATORY_CARE_PROVIDER_SITE_OTHER): Payer: Medicare Other | Admitting: Family Medicine

## 2016-11-14 VITALS — BP 124/76 | HR 80 | Ht 60.5 in | Wt 154.0 lb

## 2016-11-14 DIAGNOSIS — H8102 Meniere's disease, left ear: Secondary | ICD-10-CM

## 2016-11-14 DIAGNOSIS — Z0000 Encounter for general adult medical examination without abnormal findings: Secondary | ICD-10-CM | POA: Diagnosis not present

## 2016-11-14 DIAGNOSIS — E782 Mixed hyperlipidemia: Secondary | ICD-10-CM

## 2016-11-14 DIAGNOSIS — Z5181 Encounter for therapeutic drug level monitoring: Secondary | ICD-10-CM | POA: Diagnosis not present

## 2016-11-14 DIAGNOSIS — M8580 Other specified disorders of bone density and structure, unspecified site: Secondary | ICD-10-CM

## 2016-11-14 DIAGNOSIS — M1A9XX0 Chronic gout, unspecified, without tophus (tophi): Secondary | ICD-10-CM | POA: Diagnosis not present

## 2016-11-14 DIAGNOSIS — E039 Hypothyroidism, unspecified: Secondary | ICD-10-CM | POA: Diagnosis not present

## 2016-11-14 DIAGNOSIS — I10 Essential (primary) hypertension: Secondary | ICD-10-CM | POA: Diagnosis not present

## 2016-11-14 DIAGNOSIS — R7301 Impaired fasting glucose: Secondary | ICD-10-CM

## 2016-11-14 LAB — POCT URINALYSIS DIP (PROADVANTAGE DEVICE)
BILIRUBIN UA: NEGATIVE
BILIRUBIN UA: NEGATIVE mg/dL
GLUCOSE UA: NEGATIVE mg/dL
NITRITE UA: NEGATIVE
PH UA: 6.5 (ref 5.0–8.0)
Protein Ur, POC: NEGATIVE mg/dL
RBC UA: NEGATIVE
SPECIFIC GRAVITY, URINE: 1.02
Urobilinogen, Ur: NEGATIVE

## 2016-11-14 MED ORDER — ALLOPURINOL 100 MG PO TABS: 100.0000 mg | ORAL_TABLET | Freq: Every day | ORAL | 3 refills | Status: DC

## 2016-11-14 MED ORDER — SYNTHROID 50 MCG PO TABS: ORAL_TABLET | ORAL | 1 refills | Status: DC

## 2016-11-14 MED ORDER — FUROSEMIDE 20 MG PO TABS: ORAL_TABLET | ORAL | 3 refills | Status: DC

## 2016-11-14 MED ORDER — LOSARTAN POTASSIUM 50 MG PO TABS: 50.0000 mg | ORAL_TABLET | Freq: Every day | ORAL | 1 refills | Status: DC

## 2016-11-14 NOTE — Patient Instructions (Addendum)
HEALTH MAINTENANCE RECOMMENDATIONS:  It is recommended that you get at least 30 minutes of aerobic exercise at least 5 days/week (for weight loss, you may need as much as 60-90 minutes). This can be any activity that gets your heart rate up. This can be divided in 10-15 minute intervals if needed, but try and build up your endurance at least once a week.  Weight bearing exercise is also recommended twice weekly.  Eat a healthy diet with lots of vegetables, fruits and fiber.  "Colorful" foods have a lot of vitamins (ie green vegetables, tomatoes, red peppers, etc).  Limit sweet tea, regular sodas and alcoholic beverages, all of which has a lot of calories and sugar.  Up to 1 alcoholic drink daily may be beneficial for women (unless trying to lose weight, watch sugars).  Drink a lot of water.  Calcium recommendations are 1200-1500 mg daily (1500 mg for postmenopausal women or women without ovaries), and vitamin D 1000 IU daily.  This should be obtained from diet and/or supplements (vitamins), and calcium should not be taken all at once, but in divided doses.  Monthly self breast exams and yearly mammograms for women over the age of 21 is recommended.  Sunscreen of at least SPF 30 should be used on all sun-exposed parts of the skin when outside between the hours of 10 am and 4 pm (not just when at beach or pool, but even with exercise, golf, tennis, and yard work!)  Use a sunscreen that says "broad spectrum" so it covers both UVA and UVB rays, and make sure to reapply every 1-2 hours.  Remember to change the batteries in your smoke detectors when changing your clock times in the spring and fall.  Use your seat belt every time you are in a car, and please drive safely and not be distracted with cell phones and texting while driving.   Kaitlyn Abbott , Thank you for taking time to come for your Medicare Wellness Visit. I appreciate your ongoing commitment to your health goals. Please review the following  plan we discussed and let me know if I can assist you in the future.   These are the goals we discussed: Goals    None      This is a list of the screening recommended for you and due dates:  Health Maintenance  Topic Date Due  . Flu Shot  10/30/2016  . Colon Cancer Screening  03/01/2018  . Tetanus Vaccine  03/23/2022  . DEXA scan (bone density measurement)  Completed  . Pneumonia vaccines  Completed   We will discuss colonoscopy versus Cologard as screening for colon cancer next year. Your bone density test will be due again next July (2019).  I recommend getting the new shingles vaccine (Shingrix). You will need to check with your insurance to see if it is covered, and if covered by Medicare Part D, you need to get from the pharmacy rather than our office.  It is a series of 2 injections, spaced 2 months apart. I would wait until you see commercials on TV or until 2019 when the supply is better (currently on manufacturer backorder).  You need to be sure to get both doses within a 6 month time frame.   Increase your fish oil to 4/ day (2 capsules twice daily). Continue to try and limit your sweets, sugars, junk, fried foods. Continue the 20mg  rosuvastatin and the fenofibrate.  If your numbers remain off, we might bump up the statin at  your next check.  Your thyroid test was borderline.  We aren't changing your dose, but please contact us to recheck the test if you notice increasing fatigue, weight gain, hair loss, constipation, feeling cold, worsening dry skin or other problems.  Your uric acid level was a little higher than in the past. Since you aren't having any gout flares, I'm not going to adjust your allopurinol dose.  Be careful with your diet, and let us know if you start having more problems with gouty pain.   Low-Purine Diet Purines are compounds that affect the level of uric acid in your body. A low-purine diet is a diet that is low in purines. Eating a low-purine diet  can prevent the level of uric acid in your body from getting too high and causing gout or kidney stones or both. What do I need to know about this diet?  Choose low-purine foods. Examples of low-purine foods are listed in the next section.  Drink plenty of fluids, especially water. Fluids can help remove uric acid from your body. Try to drink 8-16 cups (1.9-3.8 L) a day.  Limit foods high in fat, especially saturated fat, as fat makes it harder for the body to get rid of uric acid. Foods high in saturated fat include pizza, cheese, ice cream, whole milk, fried foods, and gravies. Choose foods that are lower in fat and lean sources of protein. Use olive oil when cooking as it contains healthy fats that are not high in saturated fat.  Limit alcohol. Alcohol interferes with the elimination of uric acid from your body. If you are having a gout attack, avoid all alcohol.  Keep in mind that different people's bodies react differently to different foods. You will probably learn over time which foods do or do not affect you. If you discover that a food tends to cause your gout to flare up, avoid eating that food. You can more freely enjoy foods that do not cause problems. If you have any questions about a food item, talk to your dietitian or health care provider. Which foods are low, moderate, and high in purines? The following is a list of foods that are low, moderate, and high in purines. You can eat any amount of the foods that are low in purines. You may be able to have small amounts of foods that are moderate in purines. Ask your health care provider how much of a food moderate in purines you can have. Avoid foods high in purines. Grains  Foods low in purines: Enriched white bread, pasta, rice, cake, cornbread, popcorn.  Foods moderate in purines: Whole-grain breads and cereals, wheat germ, bran, oatmeal. Uncooked oatmeal. Dry wheat bran or wheat germ.  Foods high in purines: Pancakes, [place] toast,  biscuits, muffins. Vegetables  Foods low in purines: All vegetables, except those that are moderate in purines.  Foods moderate in purines: Asparagus, cauliflower, spinach, mushrooms, green peas. Fruits  All fruits are low in purines. Meats and other Protein Foods  Foods low in purines: Eggs, nuts, peanut butter.  Foods moderate in purines: 80-90% lean beef, lamb, veal, pork, poultry, fish, eggs, peanut butter, nuts. Crab, lobster, oysters, and shrimp. Cooked dried beans, peas, and lentils.  Foods high in purines: Anchovies, sardines, herring, mussels, tuna, codfish, scallops, trout, and haddock. Tomasa BlaseBacon. Organ meats (such as liver or kidney). Tripe. Game meat. Goose. Sweetbreads. Dairy  All dairy foods are low in purines. Low-fat and fat-free dairy products are best because they are low in  saturated fat. Beverages  Drinks low in purines: Water, carbonated beverages, tea, coffee, cocoa.  Drinks moderate in purines: Soft drinks and other drinks sweetened with high-fructose corn syrup. Juices. To find whether a food or drink is sweetened with high-fructose corn syrup, look at the ingredients list.  Drinks high in purines: Alcoholic beverages (such as beer). Condiments  Foods low in purines: Salt, herbs, olives, pickles, relishes, vinegar.  Foods moderate in purines: Butter, margarine, oils, mayonnaise. Fats and Oils  Foods low in purines: All types, except gravies and sauces made with meat.  Foods high in purines: Gravies and sauces made with meat. Other Foods  Foods low in purines: Sugars, sweets, gelatin. Cake. Soups made without meat.  Foods moderate in purines: Meat-based or fish-based soups, broths, or bouillons. Foods and drinks sweetened with high-fructose corn syrup.  Foods high in purines: High-fat desserts (such as ice cream, cookies, cakes, pies, doughnuts, and chocolate). Contact your dietitian for more information on foods that are not listed here. This  information is not intended to replace advice given to you by your health care provider. Make sure you discuss any questions you have with your health care provider. Document Released: 07/13/2010 Document Revised: 08/24/2015 Document Reviewed: 02/22/2013 Elsevier Interactive Patient Education  2017 [employer].

## 2016-12-06 ENCOUNTER — Ambulatory Visit
Admission: RE | Admit: 2016-12-06 | Discharge: 2016-12-06 | Disposition: A | Discharge status: Home / Self Care | Payer: Medicare Other | Source: Ambulatory Visit | Attending: Family Medicine | Admitting: Family Medicine

## 2016-12-06 DIAGNOSIS — Z1231 Encounter for screening mammogram for malignant neoplasm of breast: Secondary | ICD-10-CM

## 2016-12-20 ENCOUNTER — Encounter: Payer: Self-pay | Admitting: Family Medicine

## 2017-01-07 ENCOUNTER — Other Ambulatory Visit: Payer: Self-pay | Admitting: Family Medicine

## 2017-01-07 DIAGNOSIS — E782 Mixed hyperlipidemia: Secondary | ICD-10-CM

## 2017-01-07 DIAGNOSIS — M8580 Other specified disorders of bone density and structure, unspecified site: Secondary | ICD-10-CM

## 2017-01-21 ENCOUNTER — Other Ambulatory Visit (INDEPENDENT_AMBULATORY_CARE_PROVIDER_SITE_OTHER): Payer: Medicare Other

## 2017-01-21 DIAGNOSIS — Z23 Encounter for immunization: Secondary | ICD-10-CM

## 2017-04-09 ENCOUNTER — Other Ambulatory Visit: Payer: Self-pay | Admitting: Family Medicine

## 2017-04-09 DIAGNOSIS — H8102 Meniere's disease, left ear: Secondary | ICD-10-CM

## 2017-05-10 ENCOUNTER — Other Ambulatory Visit: Payer: Self-pay | Admitting: Family Medicine

## 2017-05-10 DIAGNOSIS — I10 Essential (primary) hypertension: Secondary | ICD-10-CM

## 2017-05-15 ENCOUNTER — Other Ambulatory Visit: Payer: Medicare Other

## 2017-05-15 DIAGNOSIS — E039 Hypothyroidism, unspecified: Secondary | ICD-10-CM

## 2017-05-15 DIAGNOSIS — E782 Mixed hyperlipidemia: Secondary | ICD-10-CM

## 2017-05-15 DIAGNOSIS — I10 Essential (primary) hypertension: Secondary | ICD-10-CM

## 2017-05-15 DIAGNOSIS — Z5181 Encounter for therapeutic drug level monitoring: Secondary | ICD-10-CM

## 2017-05-15 NOTE — Addendum Note (Signed)
Addended by: Herminio CommonsJOHNSON, Jackqueline Aquilar A on: 05/15/2017 10:24 AM   Modules accepted: Orders

## 2017-05-16 LAB — COMPREHENSIVE METABOLIC PANEL
ALT: 26 IU/L (ref 0–32)
AST: 25 IU/L (ref 0–40)
Albumin/Globulin Ratio: 2.3 — ABNORMAL HIGH (ref 1.2–2.2)
Albumin: 4.5 g/dL (ref 3.5–4.8)
Alkaline Phosphatase: 29 IU/L — ABNORMAL LOW (ref 39–117)
BUN/Creatinine Ratio: 30 — ABNORMAL HIGH (ref 12–28)
BUN: 31 mg/dL — ABNORMAL HIGH (ref 8–27)
Bilirubin Total: 0.5 mg/dL (ref 0.0–1.2)
CALCIUM: 10 mg/dL (ref 8.7–10.3)
CO2: 22 mmol/L (ref 20–29)
CREATININE: 1.04 mg/dL — AB (ref 0.57–1.00)
Chloride: 101 mmol/L (ref 96–106)
GFR, EST AFRICAN AMERICAN: 61 mL/min/{1.73_m2} (ref >59)
GFR, EST NON AFRICAN AMERICAN: 53 mL/min/{1.73_m2} — AB (ref >59)
Globulin, Total: 2 g/dL (ref 1.5–4.5)
Glucose: 103 mg/dL — ABNORMAL HIGH (ref 65–99)
Potassium: 4.6 mmol/L (ref 3.5–5.2)
Sodium: 140 mmol/L (ref 134–144)
TOTAL PROTEIN: 6.5 g/dL (ref 6.0–8.5)

## 2017-05-16 LAB — LIPID PANEL
CHOLESTEROL TOTAL: 152 mg/dL (ref 100–199)
Chol/HDL Ratio: 4.1 ratio (ref 0.0–4.4)
HDL: 37 mg/dL — ABNORMAL LOW (ref >39)
LDL CALC: 79 mg/dL (ref 0–99)
Triglycerides: 179 mg/dL — ABNORMAL HIGH (ref 0–149)
VLDL Cholesterol Cal: 36 mg/dL (ref 5–40)

## 2017-05-16 LAB — TSH: TSH: 3.16 u[IU]/mL (ref 0.450–4.500)

## 2017-05-18 NOTE — Progress Notes (Signed)
Chief Complaint  Patient presents with  . Hypertension    nonfasting med check, labs already done. No concerns.     Hypertension follow-up: BP's have been running high 120's-low 130's (rarely 140)/low 80's.  She reports compliance with medications, follow low sodium diet.  Denies headaches, dizziness, chest pain.  Mixed hyperlipidemia: She has been trying to follow a lowfat diet. She only rarely eats fried foods.  She admits her downfall is pastas, breads (with butter), but she has cut back on this (but still eats more than she should). She reports compliance with her medications (Crestor, fenofibrate and fish oil), and no side effects. She is taking fish oil 2 BID (increased from 3/d at last check).  Meniere's disease--She is taking furosemide (and potassium) without side effects or muscle cramps. Hasn't had any flares of vertigo. She has decreased hearing in the left ear, and got a hearing aid for the left--it is working very well for her. She saw Dr. Benjamine Mola in [place] recommended continuing low sodium diet and furosemide. She had cerumen impaction and this was removed. She has f/u with him next month.  Gout--no flare in a few years. No side effects to allopurinol.  GERD: She is taking the prilosec now just as needed, once or twice a week. . Denies dysphagia. Slight cough from PND today (not from reflux).  Hypothyroidism: She is compliant with taking her Synthroid on empty stomach, separate from other medications. She never really had any symptoms related to her thyroid. She denies any changes in hair/skin/bowels/moods.Her last TSH at her physical in 10/2016 was borderline in the 4 range, so was repeated with the labs done prior to her visit--see below.  Prediabetes--A1c had been up to 5.9 in 04/2015. It was normal at 5.2 in 04/2016, and 5.3 at her last physical in 10/2016.  She has tried to cut back on carbs, and walk more (3 times/week, 2-3 miles). Doesn't drink regular soda,  sweet tea.  PMH, PSH, SH reviewed  Outpatient Encounter Medications as of 05/19/2017  Medication Sig Note  . alendronate (FOSAMAX) 70 MG tablet TAKE 1 TABLET BY MOUTH EVERY 7 DAYS WITH A FULL GLASS OF WATER AND ON AN EMPTY STOMACH   . allopurinol (ZYLOPRIM) 100 MG tablet Take 1 tablet (100 mg total) by mouth daily.   . Calcium-Magnesium-Vitamin D (CITRACAL SLOW RELEASE PO) Take 1 tablet by mouth daily. 10/18/2015: Now taking Caltrate 1254m slow release, with Vitamin D  . Coenzyme Q10 (COQ10) 100 MG CAPS Take 100 mg by mouth daily.   . fenofibrate 54 MG tablet TAKE 1 TABLET(54 MG) BY MOUTH DAILY   . furosemide (LASIX) 20 MG tablet TAKE 1 TABLET(20 MG) BY MOUTH DAILY   . losartan (COZAAR) 50 MG tablet Take 1 tablet (50 mg total) by mouth daily.   . Omega-3 Fatty Acids (FISH OIL) 1200 MG CAPS Take 4 capsules by mouth daily.  05/19/2017: 2 qam and 2pqm  . potassium chloride SA (K-DUR,KLOR-CON) 20 MEQ tablet TAKE 1 TABLET(20 MEQ) BY MOUTH DAILY   . rosuvastatin (CRESTOR) 20 MG tablet TAKE 1 TABLET(20 MG) BY MOUTH DAILY   . SYNTHROID 50 MCG tablet TAKE 1 TABLET (50 MCG) DAILY BEFORE BREAKFAST   . timolol (TIMOPTIC) 0.5 % ophthalmic solution INSTILL 1 DROP INTO BOTH EYES QAM UTD 04/13/2015: Received from: External Pharmacy  . Travoprost, BAK Free, (TRAVATAN) 0.004 % SOLN ophthalmic solution Place 1 drop into both eyes at bedtime.   . [DISCONTINUED] clindamycin (CLEOCIN) 150 MG capsule    .  hydrocortisone 2.5 % cream Apply to affected areas of skin twice daily as needed for rash/itching (Patient not taking: Reported on 05/19/2017) 11/14/2016: Uses to vaginal area 2x/week prn  . omeprazole (PRILOSEC) 20 MG capsule Take 20 mg by mouth daily. 10/10/2014: Only takes prn  . [DISCONTINUED] alendronate (FOSAMAX) 70 MG tablet TAKE 1 TABLET BY MOUTH EVERY 7 DAYS WITH A FULL GLASS OF WATER AND ON AN EMPTY STOMACH   . [DISCONTINUED] naproxen (NAPROSYN) 500 MG tablet TK 1 T PO  TID    Facility-Administered Encounter  Medications as of 05/19/2017  Medication  . influenza  inactive virus vaccine (FLUZONE/FLUARIX) injection 0.5 mL   Allergies  Allergen Reactions  . Lisinopril Cough  . Penicillins Rash    ROS: no fever, chills, headaches, dizziness, chest pain, shortness of breath. No vertigo. Hearing improved with hearing aid. No nausea, vomiting, heartburn. Gets cough as reflux manifestation, 1-2x/week.  No change in bowels. No bleeding, bruising, rash, depression or other concerns.   PHYSICAL EXAM:  BP 140/80   Pulse 80   Ht 5' 0.5" (1.537 m)   Wt 156 lb 9.6 oz (71 kg)   BMI 30.08 kg/m   Wt Readings from Last 3 Encounters:  05/19/17 156 lb 9.6 oz (71 kg)  11/14/16 154 lb (69.9 kg)  04/24/16 157 lb (71.2 kg)   146/74 on repeat by MD  Well appearing, pleasant female in no distress HEENT: conjunctiva and sclera clear, EOMI, OP clear Neck: no lymphadenopathy or mass Heart: regular rate and rhythm Lungs: clear bilaterally Back: no CVA tenderness Abdomen: soft, nontender, no mass Extremities: no edema, normal pulses Skin: normal turgor, no rash/lesions Psych: normal mood, affect, hygiene and grooming   Lab Results  Component Value Date   TSH 3.160 05/15/2017     Chemistry      Component Value Date/Time   NA 140 05/15/2017 1052   K 4.6 05/15/2017 1052   CL 101 05/15/2017 1052   CO2 22 05/15/2017 1052   BUN 31 (H) 05/15/2017 1052   CREATININE 1.04 (H) 05/15/2017 1052   CREATININE 0.95 (H) 11/12/2016 0920      Component Value Date/Time   CALCIUM 10.0 05/15/2017 1052   ALKPHOS 29 (L) 05/15/2017 1052   AST 25 05/15/2017 1052   ALT 26 05/15/2017 1052   BILITOT 0.5 05/15/2017 1052     Fasting glucose 103  Lab Results  Component Value Date   CHOL 152 05/15/2017   HDL 37 (L) 05/15/2017   LDLCALC 79 05/15/2017   TRIG 179 (H) 05/15/2017   CHOLHDL 4.1 05/15/2017    ASSESSMENT/PLAN:  Essential hypertension, benign - borderline today, lower at home; cont low Na diet, daily  exercise; encouraged weight loss.  f/u sooner if elevated BP's - Plan: losartan (COZAAR) 50 MG tablet, Comprehensive metabolic panel  Mixed hyperlipidemia - continues to have slightly elevated TG, low HDL. Cont 4/d fish oil, rx regimen. Diet reviewed. recheck 6 mos - Plan: Lipid panel  Impaired fasting glucose - mild. reviewed need for exercise, weight loss, low carb low sugar diet - Plan: Hemoglobin A1c, Comprehensive metabolic panel  Hypothyroidism, unspecified type - Plan: SYNTHROID 50 MCG tablet, TSH  Meniere's disease of left ear - controlled on current regimen, and hearing improved with hearing aid  Chronic gout without tophus, unspecified cause, unspecified site - Plan: Uric acid  Medication monitoring encounter - Plan: CBC with Differential/Platelet, Lipid panel, TSH, Uric acid, Comprehensive metabolic panel   F/u 6 months with fasting labs prior  CPE c-met, TSH, uric acid, CBC, lipids, A1c

## 2017-05-19 ENCOUNTER — Ambulatory Visit: Payer: Medicare Other | Admitting: Family Medicine

## 2017-05-19 ENCOUNTER — Encounter: Payer: Self-pay | Admitting: Family Medicine

## 2017-05-19 VITALS — BP 140/80 | HR 80 | Ht 60.5 in | Wt 156.6 lb

## 2017-05-19 DIAGNOSIS — R7301 Impaired fasting glucose: Secondary | ICD-10-CM

## 2017-05-19 DIAGNOSIS — Z5181 Encounter for therapeutic drug level monitoring: Secondary | ICD-10-CM | POA: Diagnosis not present

## 2017-05-19 DIAGNOSIS — E039 Hypothyroidism, unspecified: Secondary | ICD-10-CM | POA: Diagnosis not present

## 2017-05-19 DIAGNOSIS — I10 Essential (primary) hypertension: Secondary | ICD-10-CM

## 2017-05-19 DIAGNOSIS — M1A9XX0 Chronic gout, unspecified, without tophus (tophi): Secondary | ICD-10-CM

## 2017-05-19 DIAGNOSIS — H8102 Meniere's disease, left ear: Secondary | ICD-10-CM

## 2017-05-19 DIAGNOSIS — E782 Mixed hyperlipidemia: Secondary | ICD-10-CM

## 2017-05-19 MED ORDER — LOSARTAN POTASSIUM 50 MG PO TABS: 50.0000 mg | ORAL_TABLET | Freq: Every day | ORAL | 1 refills | Status: DC

## 2017-05-19 MED ORDER — SYNTHROID 50 MCG PO TABS: ORAL_TABLET | ORAL | 1 refills | Status: DC

## 2017-07-02 ENCOUNTER — Other Ambulatory Visit: Payer: Self-pay | Admitting: Family Medicine

## 2017-07-02 DIAGNOSIS — M8580 Other specified disorders of bone density and structure, unspecified site: Secondary | ICD-10-CM

## 2017-07-07 ENCOUNTER — Other Ambulatory Visit: Payer: Self-pay | Admitting: Family Medicine

## 2017-07-07 DIAGNOSIS — E782 Mixed hyperlipidemia: Secondary | ICD-10-CM

## 2017-07-07 DIAGNOSIS — H8102 Meniere's disease, left ear: Secondary | ICD-10-CM

## 2017-07-17 ENCOUNTER — Encounter: Payer: Self-pay | Admitting: Family Medicine

## 2017-10-05 ENCOUNTER — Other Ambulatory Visit: Payer: Self-pay | Admitting: Family Medicine

## 2017-10-05 DIAGNOSIS — E782 Mixed hyperlipidemia: Secondary | ICD-10-CM

## 2017-10-05 DIAGNOSIS — H8102 Meniere's disease, left ear: Secondary | ICD-10-CM

## 2017-11-04 ENCOUNTER — Other Ambulatory Visit: Payer: Self-pay | Admitting: Family Medicine

## 2017-11-04 DIAGNOSIS — H8102 Meniere's disease, left ear: Secondary | ICD-10-CM

## 2017-11-10 ENCOUNTER — Other Ambulatory Visit: Payer: Medicare Other

## 2017-11-10 DIAGNOSIS — E039 Hypothyroidism, unspecified: Secondary | ICD-10-CM

## 2017-11-10 DIAGNOSIS — Z5181 Encounter for therapeutic drug level monitoring: Secondary | ICD-10-CM

## 2017-11-10 DIAGNOSIS — E782 Mixed hyperlipidemia: Secondary | ICD-10-CM

## 2017-11-10 DIAGNOSIS — M1A9XX0 Chronic gout, unspecified, without tophus (tophi): Secondary | ICD-10-CM

## 2017-11-10 DIAGNOSIS — I10 Essential (primary) hypertension: Secondary | ICD-10-CM

## 2017-11-10 DIAGNOSIS — R7301 Impaired fasting glucose: Secondary | ICD-10-CM

## 2017-11-11 LAB — CBC WITH DIFFERENTIAL/PLATELET
BASOS ABS: 0 10*3/uL (ref 0.0–0.2)
BASOS: 1 %
EOS (ABSOLUTE): 0.3 10*3/uL (ref 0.0–0.4)
EOS: 5 %
HEMATOCRIT: 40.4 % (ref 34.0–46.6)
HEMOGLOBIN: 13.1 g/dL (ref 11.1–15.9)
IMMATURE GRANS (ABS): 0 10*3/uL (ref 0.0–0.1)
Immature Granulocytes: 0 %
LYMPHS ABS: 1.5 10*3/uL (ref 0.7–3.1)
Lymphs: 27 %
MCH: 29.2 pg (ref 26.6–33.0)
MCHC: 32.4 g/dL (ref 31.5–35.7)
MCV: 90 fL (ref 79–97)
MONOCYTES: 11 %
Monocytes Absolute: 0.6 10*3/uL (ref 0.1–0.9)
NEUTROS ABS: 3.1 10*3/uL (ref 1.4–7.0)
Neutrophils: 56 %
Platelets: 195 10*3/uL (ref 150–450)
RBC: 4.48 x10E6/uL (ref 3.77–5.28)
RDW: 14.4 % (ref 12.3–15.4)
WBC: 5.4 10*3/uL (ref 3.4–10.8)

## 2017-11-11 LAB — COMPREHENSIVE METABOLIC PANEL
ALK PHOS: 29 IU/L — AB (ref 39–117)
ALT: 21 IU/L (ref 0–32)
AST: 18 IU/L (ref 0–40)
Albumin/Globulin Ratio: 1.8 (ref 1.2–2.2)
Albumin: 4.3 g/dL (ref 3.5–4.8)
BUN/Creatinine Ratio: 23 (ref 12–28)
BUN: 23 mg/dL (ref 8–27)
Bilirubin Total: 0.5 mg/dL (ref 0.0–1.2)
CALCIUM: 9.9 mg/dL (ref 8.7–10.3)
CO2: 21 mmol/L (ref 20–29)
CREATININE: 0.98 mg/dL (ref 0.57–1.00)
Chloride: 103 mmol/L (ref 96–106)
GFR calc Af Amer: 65 mL/min/{1.73_m2} (ref >59)
GFR, EST NON AFRICAN AMERICAN: 56 mL/min/{1.73_m2} — AB (ref >59)
GLOBULIN, TOTAL: 2.4 g/dL (ref 1.5–4.5)
GLUCOSE: 94 mg/dL (ref 65–99)
Potassium: 4.5 mmol/L (ref 3.5–5.2)
SODIUM: 141 mmol/L (ref 134–144)
Total Protein: 6.7 g/dL (ref 6.0–8.5)

## 2017-11-11 LAB — HEMOGLOBIN A1C
ESTIMATED AVERAGE GLUCOSE: 111 mg/dL
Hgb A1c MFr Bld: 5.5 % (ref 4.8–5.6)

## 2017-11-11 LAB — LIPID PANEL
CHOLESTEROL TOTAL: 145 mg/dL (ref 100–199)
Chol/HDL Ratio: 4 ratio (ref 0.0–4.4)
HDL: 36 mg/dL — AB (ref >39)
LDL Calculated: 76 mg/dL (ref 0–99)
TRIGLYCERIDES: 164 mg/dL — AB (ref 0–149)
VLDL CHOLESTEROL CAL: 33 mg/dL (ref 5–40)

## 2017-11-11 LAB — URIC ACID: URIC ACID: 5.8 mg/dL (ref 2.5–7.1)

## 2017-11-11 LAB — TSH: TSH: 3.19 u[IU]/mL (ref 0.450–4.500)

## 2017-11-11 NOTE — Patient Instructions (Addendum)
HEALTH MAINTENANCE RECOMMENDATIONS:  It is recommended that you get at least 30 minutes of aerobic exercise at least 5 days/week (for weight loss, you may need as much as 60-90 minutes). This can be any activity that gets your heart rate up. This can be divided in 10-15 minute intervals if needed, but try and build up your endurance at least once a week.  Weight bearing exercise is also recommended twice weekly.  Eat a healthy diet with lots of vegetables, fruits and fiber.  "Colorful" foods have a lot of vitamins (ie green vegetables, tomatoes, red peppers, etc).  Limit sweet tea, regular sodas and alcoholic beverages, all of which has a lot of calories and sugar.  Up to 1 alcoholic drink daily may be beneficial for women (unless trying to lose weight, watch sugars).  Drink a lot of water.  Calcium recommendations are 1200-1500 mg daily (1500 mg for postmenopausal women or women without ovaries), and vitamin D 1000 IU daily.  This should be obtained from diet and/or supplements (vitamins), and calcium should not be taken all at once, but in divided doses.  Monthly self breast exams and yearly mammograms for women over the age of 61 is recommended.  Sunscreen of at least SPF 30 should be used on all sun-exposed parts of the skin when outside between the hours of 10 am and 4 pm (not just when at beach or pool, but even with exercise, golf, tennis, and yard work!)  Use a sunscreen that says "broad spectrum" so it covers both UVA and UVB rays, and make sure to reapply every 1-2 hours.  Remember to change the batteries in your smoke detectors when changing your clock times in the spring and fall.  Use your seat belt every time you are in a car, and please drive safely and not be distracted with cell phones and texting while driving.   Ms. Benecke , Thank you for taking time to come for your Medicare Wellness Visit. I appreciate your ongoing commitment to your health goals. Please review the following  plan we discussed and let me know if I can assist you in the future.   These are the goals we discussed: Goals   None     This is a list of the screening recommended for you and due dates:  Health Maintenance  Topic Date Due  . Flu Shot  10/30/2017  . Tetanus Vaccine  03/23/2022  . DEXA scan (bone density measurement)  Completed  . Pneumonia vaccines  Completed   Bone density is due now.  Please call the breast center to schedule this (can be done the same day as your mammogram, due in September, if they are able to).  Get high dose flu shot in the Fall (not yet available in our office).  I recommend getting the new shingles vaccine (Shingrix). You will need to check with your insurance to see if it is covered, and if covered by Medicare Part D, you need to get from the pharmacy rather than our office.  It is a series of 2 injections, spaced 2 months apart.  You are due for routine colon cancer screening.  We discussed colonoscopy and Cologuard, and will be referring you for Cologuard. If this is abnormal, we will send you to Dr. Ewing Schlein for colonoscopy.   MyPlate from USDA The general, healthful diet is based on the 2010 Dietary Guidelines for Americans. The amount of food you need to eat from each food group depends on your  age, sex, and level of physical activity and can be individualized by a dietitian. Go to https://www.[place].org/ChooseMyPlate.gov for more information. What do I need to know about the MyPlate plan?  Enjoy your food, but eat less.  Avoid oversized portions. ?  of your plate should include fruits and vegetables. ?  of your plate should be grains. ?  of your plate should be protein. Grains  Make at least half of your grains whole grains.  For a 2,000 calorie daily food plan, eat 6 oz every day.  1 oz is about 1 slice bread, 1 cup cereal, or  cup cooked rice, cereal, or pasta. Vegetables  Make half your plate fruits and vegetables.  For a 2,000 calorie daily food plan, eat  2 cups every day.  1 cup is about 1 cup raw or cooked vegetables or vegetable juice or 2 cups raw leafy greens. Fruits  Make half your plate fruits and vegetables.  For a 2,000 calorie daily food plan, eat 2 cups every day.  1 cup is about 1 cup fruit or 100% fruit juice or  cup dried fruit. Protein  For a 2,000 calorie daily food plan, eat 5 oz every day.  1 oz is about 1 oz meat, poultry, or fish,  cup cooked beans, 1 egg, 1 Tbsp peanut butter, or  oz nuts or seeds. Dairy  Switch to fat-free or low-fat (1%) milk.  For a 2,000 calorie daily food plan, eat 3 cups every day.  1 cup is about 1 cup milk or yogurt or soy milk (soy beverage), 1 oz natural cheese, or 2 oz processed cheese. Fats, Oils, and Empty Calories  Only small amounts of oils are recommended.  Empty calories are calories from solid fats or added sugars.  Compare sodium in foods like soup, bread, and frozen meals. Choose the foods with lower numbers.  Drink water instead of sugary drinks. What foods can I eat? Grains Whole grains such as whole wheat, quinoa, millet, and bulgur. Bread, rolls, and pasta made from whole grains. Brown or wild rice. Hot or cold cereals made from whole grains and without added sugar. Vegetables All fresh vegetables, especially fresh red, dark green, or orange vegetables. Peas and beans. Low-sodium frozen or canned vegetables prepared without added salt. Low-sodium vegetable juices. Fruits All fresh, frozen, and dried fruits. Canned fruit packed in water or fruit juice without added sugar. Fruit juices without added sugar. Meats and Other Protein Sources Boiled, baked, or grilled lean meat trimmed of fat. Skinless poultry. Fresh seafood and shellfish. Canned seafood packed in water. Unsalted nuts and unsalted nut butters. Tofu. Dried beans and pea. Eggs. Dairy Low-fat or fat-free milk, yogurt, and cheeses. Sweets and Desserts Frozen desserts made from low-fat milk. Fats and  Oils Olive, peanut, and canola oils and margarine. Salad dressing and mayonnaise made from these oils. Other Soups and casseroles made from allowed ingredients and without added fat or salt. The items listed above may not be a complete list of recommended foods or beverages. Contact your dietitian for more options. What foods are not recommended? Grains Sweetened, low-fiber cereals. Packaged baked goods. Snack crackers and chips. Cheese crackers, butter crackers, and biscuits. Frozen waffles, sweet breads, doughnuts, pastries, packaged baking mixes, pancakes, cakes, and cookies. Vegetables Regular canned or frozen vegetables or vegetables prepared with salt. Canned tomatoes. Canned tomato sauce. Fried vegetables. Vegetables in cream sauce or cheese sauce. Fruits Fruits packed in syrup or made with added sugar. Meats and Other Protein Sources  Marbled or fatty meats such as ribs. Poultry with skin. Fried meats, poultry, eggs, or fish. Sausages, hot dogs, and deli meats such as pastrami, bologna, or salami. Dairy Whole milk, cream, cheeses made from whole milk, sour cream. Ice cream or yogurt made from whole milk or with added sugar. Beverages For adults, no more than one alcoholic drink per day. Regular soft drinks or other sugary beverages. Juice drinks. Sweets and Desserts Sugary or fatty desserts, candy, and other sweets. Fats and Oils Solid shortening or partially hydrogenated oils. Solid margarine. Margarine that contains trans fats. Butter. The items listed above may not be a complete list of foods and beverages to avoid. Contact your dietitian for more information. This information is not intended to replace advice given to you by your health care provider. Make sure you discuss any questions you have with your health care provider. Document Released: 04/07/2007 Document Revised: 08/24/2015 Document Reviewed: 02/24/2013 [employer] Interactive Patient Education  [employer]2018 [employer]

## 2017-11-11 NOTE — Progress Notes (Signed)
Chief Complaint  Patient presents with  . Medicare Wellness    nonfasting AWV with pelvic. Getting eye exam next month. No concerns.     Kaitlyn Abbott is a 77 y.o. female who presents for annual physical exam, Medicare wellness visit and follow-up on chronic medical conditions.   Hypertension follow-up: BP's have been running 130's/70's. Admits she hasn't been taking them as regularly, but have all been okay.She reports compliance with medications, follow low sodium diet. Denies headaches, dizziness, chest pain. Admits to being somewhat anxious today at visit--husband is getting transfusion at cancer center.  Mixed hyperlipidemia: She has been trying to follow a lowfat diet. She only rarely eats fried foods.She admits her downfall is pastas, breads (with butter), but tries to limit it some. She reports compliance with her medications (Crestor, fenofibrate and fish oil), and no side effects. She is taking fish oil 2 BID.  Meniere's disease--She istakingfurosemide (and potassium) without side effects or muscle cramps. Hasn't had any flares of vertigo. She has decreased hearing in the left ear and uses a hearing aid. She is under the care of Dr. Benjamine Mola.  Gout--no flare in a few years. No side effects to allopurinol.  GERD: She is taking the prilosec now just as needed, maybe once a month . Denies dysphagia.  Hypothyroidism: She is compliant with taking her Synthroid on empty stomach, separate from other medications. She never really had any symptoms related to her thyroid. She denies any changes in hair/skin/bowels/moods.  Prediabetes--A1c had been up to 5.9 in 04/2015.It has been normal since then.Fasting glucose was 103 in 05/2017. She has tried to cut back on carbs, and walk more.Doesn't drink regular soda, sweet tea.  Osteopenia:  Taking alendronate without side effects. No dysphagia.  She takes Caltrate once daily, drinks milk, occasional cheese. She also takes MVI with  D.  Some weight-bearing exercise at community center 2x/week.  Previously used TAC creamto external vaginal area, prn for dry and itchy skin, as prescribed by dermatologist. She was told "skin was inflamed", not given a diagnosis of lichen sclerosis, planus or other diagnosis. OTC HC cream wasn't effective when she tried it prior to seeing the dermatologist.  We prescribed 2.5% HC cream, which has been as effective as the TAC, needs it less often than in the past, just sporadic (previously used 2x/week, less often now).  Immunization History  Administered Date(s) Administered  . Influenza Split 11/29/2010, 01/14/2012  . Influenza, High Dose Seasonal PF 01/26/2013, 12/30/2013, 01/12/2015, 01/17/2016, 01/21/2017  . Pneumococcal Conjugate-13 10/06/2013  . Pneumococcal Polysaccharide-23 01/24/2010  . Tdap 03/23/2012  . Zoster 04/21/2015   Last Pap smear: 09/2015, normal, with no high risk HPV Last mammogram:11/2016 Last colonoscopy: 2009. Denies any h/o polyps  Last DEXA: 09/2015, T-1.7.  No significant change from 09/2013; on alendronate since 2013. Dentist: regularly, twice yearly  Ophtho: yearly (and glaucoma doctor) Exercise: walks 3 times/week, 3.5-4 miles; uses handweights, and goes to the community center for weights 2x/week, and some treadmill/bike AAA screen negative 09/2015  Other doctors caring for patient include: Ophtho: Dr. Laban Emperor at [facility] Dentist: Dr. Imagene Riches Periodontist: Dr. Satira Sark Ortho: Dr. Noemi Chapel GI: None locally, seen in [place] (will use Dr. Watt Climes when due again) Derm: Dr. Derrel Nip ([place]) Podiatrist: Dr. Paulla Dolly ENT: Dr. Benjamine Mola   Depression screen:Negative  Functional Status survey:  Notable for hearing loss in L ear, unchanged (related to meniere's).  Fall screen is negative. Mini-Cog screen: normal (5)  See full screens in Epic  End  of Life Discussion: Patient has a living will and medical power of attorney; plans to make changes and get [place]  copies.   Past Medical History:  Diagnosis Date  . Gout attack 2011  . Hypertension   . Hypothyroid   . Impaired fasting glucose   . Low tension glaucoma    [facility]  . Meniere disease   . Mixed hyperlipidemia   . Osteopenia   . Vitamin D deficiency     Past Surgical History:  Procedure Laterality Date  . GANGLION CYST EXCISION  bilateral, 20 years apart   bilateral wrists  . ROTATOR CUFF REPAIR  10/2010   Right (Dr. Noemi Chapel)    Social History   Socioeconomic History  . Marital status: Married    Spouse name: Not on file  . Number of children: 2  . Years of education: Not on file  . Highest education level: Not on file  Occupational History  . Occupation: retired Surveyor, quantity, and Veterinary surgeon)  Social Needs  . Financial resource strain: Not on file  . Food insecurity:    Worry: Not on file    Inability: Not on file  . Transportation needs:    Medical: Not on file    Non-medical: Not on file  Tobacco Use  . Smoking status: Never Smoker  . Smokeless tobacco: Never Used  Substance and Sexual Activity  . Alcohol use: Yes    Comment: 3 drinks a month and occasion glass of wine., or less  . Drug use: No  . Sexual activity: Yes    Partners: Male  Lifestyle  . Physical activity:    Days per week: Not on file    Minutes per session: Not on file  . Stress: Not on file  Relationships  . Social connections:    Talks on phone: Not on file    Gets together: Not on file    Attends religious service: Not on file    Active member of club or organization: Not on file    Attends meetings of clubs or organizations: Not on file    Relationship status: Not on file  . Intimate partner violence:    Fear of current or ex partner: Not on file    Emotionally abused: Not on file    Physically abused: Not on file    Forced sexual activity: Not on file  Other Topics Concern  . Not on file  Social History Narrative   Lives with husband.  Daughter lives in [place], son  moved from [place] to [place], [place].  No pets.  Daughter is IT consultant.     Family History  Problem Relation Age of Onset  . Diabetes Mother   . Hyperlipidemia Mother   . Hypertension Mother   . Arthritis Mother   . Hypothyroidism Mother   . Aortic aneurysm Mother 30       repaired after it started leaking; thoracic (and small renal)  . Cancer Father        kidney CA, and bone CA  . Anxiety disorder Sister   . Hyperlipidemia Sister   . Hypertension Sister   . Alcohol abuse Sister   . Hyperlipidemia Daughter   . Crohn's disease Daughter   . Hyperlipidemia Son   . Arthritis Maternal Grandmother   . Heart disease Maternal Grandmother   . Diabetes Maternal Grandfather   . Heart disease Maternal Grandfather   . Stroke Maternal Grandfather   . Breast cancer Neg Hx  Outpatient Encounter Medications as of 11/12/2017  Medication Sig Note  . alendronate (FOSAMAX) 70 MG tablet TAKE 1 TABLET BY MOUTH EVERY 7 DAYS WITH A FULL GLASS OF WATER AND ON AN EMPTY STOMACH   . allopurinol (ZYLOPRIM) 100 MG tablet Take 1 tablet (100 mg total) by mouth daily.   . Calcium-Magnesium-Vitamin D (CITRACAL SLOW RELEASE PO) Take 1 tablet by mouth daily. 10/18/2015: Now taking Caltrate 1260m slow release, with Vitamin D  . Coenzyme Q10 (COQ10) 100 MG CAPS Take 100 mg by mouth daily.   . fenofibrate 54 MG tablet TAKE 1 TABLET(54 MG) BY MOUTH DAILY   . furosemide (LASIX) 20 MG tablet TAKE 1 TABLET(20 MG) BY MOUTH DAILY   . losartan (COZAAR) 50 MG tablet Take 1 tablet (50 mg total) by mouth daily.   . Omega-3 Fatty Acids (FISH OIL) 1200 MG CAPS Take 4 capsules by mouth daily.  05/19/2017: 2 qam and 2pqm  . omeprazole (PRILOSEC) 20 MG capsule Take 20 mg by mouth daily. 10/10/2014: Only takes prn  . potassium chloride SA (K-DUR,KLOR-CON) 20 MEQ tablet TAKE 1 TABLET(20 MEQ) BY MOUTH DAILY   . rosuvastatin (CRESTOR) 20 MG tablet TAKE 1 TABLET(20 MG) BY MOUTH DAILY   . SYNTHROID 50 MCG tablet TAKE 1 TABLET (50  MCG) DAILY BEFORE BREAKFAST   . timolol (TIMOPTIC) 0.5 % ophthalmic solution INSTILL 1 DROP INTO BOTH EYES QAM UTD 04/13/2015: Received from: External Pharmacy  . Travoprost, BAK Free, (TRAVATAN) 0.004 % SOLN ophthalmic solution Place 1 drop into both eyes at bedtime.   . hydrocortisone 2.5 % cream Apply to affected areas of skin twice daily as needed for rash/itching (Patient not taking: Reported on 05/19/2017) 11/14/2016: Uses to vaginal area 2x/week prn  . [DISCONTINUED] alendronate (FOSAMAX) 70 MG tablet TAKE 1 TABLET BY MOUTH EVERY 7 DAYS WITH A FULL GLASS OF WATER AND ON AN EMPTY STOMACH    Facility-Administered Encounter Medications as of 11/12/2017  Medication  . influenza  inactive virus vaccine (FLUZONE/FLUARIX) injection 0.5 mL    Allergies  Allergen Reactions  . Lisinopril Cough  . Penicillins Rash    ROS: The patient denies anorexia, fever, headaches; vision changes, decreased hearing (diminished on left, chronic/unchanged), ear pain, sore throat, breast concerns, chest pain, palpitations, dizziness, syncope, dyspnea on exertion, cough, swelling, nausea, vomiting, diarrhea, constipation, abdominal pain, melena, hematochezia, indigestion/heartburn (rare, about once a month), hematuria, incontinence, dysuria, vaginal bleeding, discharge, or odor; slight external vaginal itch is controlled with hydrocortisone cream sporadically [facility]; denies genital lesions, numbness, tingling, weakness, tremor, depression, anxiety, abnormal bleeding/bruising, or enlarged lymph nodes.  +arthritis in her hands usually just first thing in the morning has some stiffness (doesn't require meds, does better after running them in hot water); no other joint pains. Blue emu cream helps for her hand pain. No gout flares. Some pain left shoulder and arm since using weights--plans to start home exercises from her R shoulder surgery to help with the left.   PHYSICAL EXAM:  BP (!) 160/84   Pulse 84    Ht 5' 0.5" (1.537 m)   Wt 156 lb (70.8 kg)   BMI 29.97 kg/m   140/70 on repeat by MD  Wt Readings from Last 3 Encounters:  11/12/17 156 lb (70.8 kg)  05/19/17 156 lb 9.6 oz (71 kg)  11/14/16 154 lb (69.9 kg)     General Appearance:  Alert, cooperative, no distress, appears stated age   Head:  Normocephalic, without obvious abnormality, atraumatic  Eyes:  PERRL, conjunctiva/corneas clear, EOM's intact, fundi benign   Ears:  Normal TM's and external ear canals   Nose:  Nares normal, mucosa is mildly edematous, no drainage or sinus tenderness   Throat:  Lips, mucosa, and tongue normal; teeth and gums normal   Neck:  Supple, no lymphadenopathy; thyroid: no enlargement/ tenderness/nodules; no carotid bruit or JVD   Back:  Spine nontender, no curvature, ROM normal, no CVA tenderness   Lungs:  Clear to auscultation bilaterally without wheezes, rales or ronchi; respirations unlabored   Chest Wall:  No tenderness or deformity   Heart:  Regular rate and rhythm, S1 and S2 normal, no murmur, rub or gallop   Breast Exam:  No tenderness, masses, or nipple discharge or inversion. No axillary lymphadenopathy   Abdomen:  Soft, non-tender, nondistended, normoactive bowel sounds, no masses, no hepatosplenomegaly   Genitalia:  Normal external genitalia without lesions. Mild atrophic changes, and some thickening/infllammation (mild, diffuse) of external genitalia/labia majora noted.  BUS and vagina normal; no  cervical motion tenderness. No abnormal vaginal discharge. Uterus and adnexa not enlarged, nontender, no masses. Pap not performed   Rectal:  Normal tone, no masses or tenderness; guaiac negative stool   Extremities:  No clubbing, cyanosis or edema.   Pulses:  2+ and symmetric all extremities   Skin:  Skin color, texture, turgor normal, no rashes.   Lymph nodes:  Cervical, supraclavicular, and axillary nodes normal    Neurologic:  CNII-XII intact, normal strength, sensation and gait; reflexes 2+ and symmetric throughout    Psych: Normal mood, affect, hygiene and grooming   Lab Results  Component Value Date   HGBA1C 5.5 11/10/2017     Chemistry      Component Value Date/Time   NA 141 11/10/2017 1031   K 4.5 11/10/2017 1031   CL 103 11/10/2017 1031   CO2 21 11/10/2017 1031   BUN 23 11/10/2017 1031   CREATININE 0.98 11/10/2017 1031   CREATININE 0.95 (H) 11/12/2016 0920      Component Value Date/Time   CALCIUM 9.9 11/10/2017 1031   ALKPHOS 29 (L) 11/10/2017 1031   AST 18 11/10/2017 1031   ALT 21 11/10/2017 1031   BILITOT 0.5 11/10/2017 1031     Fasting glucose 94  Lab Results  Component Value Date   TSH 3.190 11/10/2017   Lab Results  Component Value Date   WBC 5.4 11/10/2017   HGB 13.1 11/10/2017   HCT 40.4 11/10/2017   MCV 90 11/10/2017   PLT 195 11/10/2017   Lab Results  Component Value Date   LABURIC 5.8 11/10/2017   Lab Results  Component Value Date   CHOL 145 11/10/2017   HDL 36 (L) 11/10/2017   LDLCALC 76 11/10/2017   TRIG 164 (H) 11/10/2017   CHOLHDL 4.0 11/10/2017    ASSESSMENT/PLAN:  Annual physical exam - Plan: POCT Urinalysis DIP (Proadvantage Device)  Medicare annual wellness visit, subsequent  Essential hypertension, benign - borderline/elevated today, normal at home (somewhat anxious today due to huband's health/transfusion). Cont current meds. Wt loss rec. cont exercise - Plan: losartan (COZAAR) 50 MG tablet  Mixed hyperlipidemia - mildly elevated TG, low HDL; cont same meds, diet reviewed  Hypothyroidism, unspecified type - Plan: SYNTHROID 50 MCG tablet, DG Bone Density  Impaired fasting glucose  Chronic gout without tophus, unspecified cause, unspecified site - no flare and uric acid <6; cont allopurinol - Plan: allopurinol (ZYLOPRIM) 100 MG tablet  Meniere disease, left - stable on current regimen  without any  vertigo; stable hearing loss.  - Plan: furosemide (LASIX) 20 MG tablet  Osteopenia, unspecified location - due for f/u DEXA.  Consider stopping alendronate (vs change med if worsening) - Plan: DG Bone Density   Discussed monthly self breast exams and yearly mammograms; at least 30 minutes of aerobic activity at least 5 days/week, weight-bearing exercise at least 2x/wk; proper sunscreen use reviewed; healthy diet, including goals of calcium and vitamin D intake and alcohol recommendations (less than or equal to 1 drink/day) reviewed; regular seatbelt use; changing batteries in smoke detectors. Immunization recommendations discussed--continue yearly high dose flu shots. Shingrix recommended, risks/side effects reviewed, to get from pharmacy.Colonoscopy recommendations reviewed--due. Discussed colonoscopy vs Cologard for screening. Interested in [employer], understands need for colonoscopy if abnormal. DEXA due--consider stopping alendronate if improving/stable.  MOST form reviewed and updated. Full code, Full Care  F/u 6 months with fasting labs prior Lipids, TSH, c-met   Medicare Attestation I have personally reviewed: The patient's medical and social history Their use of alcohol, tobacco or illicit drugs Their current medications and supplements The patient's functional ability including ADLs,fall risks, home safety risks, cognitive, and hearing and visual impairment Diet and physical activities Evidence for depression or mood disorders  The patient's weight, height and BMI have been recorded in the chart.  I have made referrals, counseling, and provided education to the patient based on review of the above and I have provided the patient with a written personalized care plan for preventive services.

## 2017-11-12 ENCOUNTER — Ambulatory Visit: Payer: Medicare Other | Admitting: Family Medicine

## 2017-11-12 ENCOUNTER — Encounter: Payer: Self-pay | Admitting: Family Medicine

## 2017-11-12 VITALS — BP 140/70 | HR 84 | Ht 60.5 in | Wt 156.0 lb

## 2017-11-12 DIAGNOSIS — E039 Hypothyroidism, unspecified: Secondary | ICD-10-CM | POA: Diagnosis not present

## 2017-11-12 DIAGNOSIS — Z0000 Encounter for general adult medical examination without abnormal findings: Secondary | ICD-10-CM | POA: Diagnosis not present

## 2017-11-12 DIAGNOSIS — E782 Mixed hyperlipidemia: Secondary | ICD-10-CM | POA: Diagnosis not present

## 2017-11-12 DIAGNOSIS — R7301 Impaired fasting glucose: Secondary | ICD-10-CM

## 2017-11-12 DIAGNOSIS — M1A9XX0 Chronic gout, unspecified, without tophus (tophi): Secondary | ICD-10-CM

## 2017-11-12 DIAGNOSIS — M8580 Other specified disorders of bone density and structure, unspecified site: Secondary | ICD-10-CM

## 2017-11-12 DIAGNOSIS — I10 Essential (primary) hypertension: Secondary | ICD-10-CM

## 2017-11-12 DIAGNOSIS — H8102 Meniere's disease, left ear: Secondary | ICD-10-CM

## 2017-11-12 LAB — POCT URINALYSIS DIP (PROADVANTAGE DEVICE)
Bilirubin, UA: NEGATIVE
Blood, UA: NEGATIVE
GLUCOSE UA: NEGATIVE mg/dL
Ketones, POC UA: NEGATIVE mg/dL
NITRITE UA: NEGATIVE
Specific Gravity, Urine: 1.02
UUROB: NEGATIVE
pH, UA: 6 (ref 5.0–8.0)

## 2017-11-12 MED ORDER — LOSARTAN POTASSIUM 50 MG PO TABS: 50.0000 mg | ORAL_TABLET | Freq: Every day | ORAL | 1 refills | Status: DC

## 2017-11-12 MED ORDER — ALLOPURINOL 100 MG PO TABS: 100.0000 mg | ORAL_TABLET | Freq: Every day | ORAL | 3 refills | Status: DC

## 2017-11-12 MED ORDER — FUROSEMIDE 20 MG PO TABS: ORAL_TABLET | ORAL | 3 refills | Status: DC

## 2017-11-12 MED ORDER — SYNTHROID 50 MCG PO TABS: ORAL_TABLET | ORAL | 1 refills | Status: DC

## 2017-11-13 ENCOUNTER — Other Ambulatory Visit: Payer: Self-pay | Admitting: *Deleted

## 2017-11-13 ENCOUNTER — Other Ambulatory Visit: Payer: Self-pay | Admitting: Family Medicine

## 2017-11-13 DIAGNOSIS — I10 Essential (primary) hypertension: Secondary | ICD-10-CM

## 2017-11-13 DIAGNOSIS — Z1211 Encounter for screening for malignant neoplasm of colon: Secondary | ICD-10-CM

## 2017-11-17 ENCOUNTER — Other Ambulatory Visit: Payer: Self-pay | Admitting: Family Medicine

## 2017-11-17 DIAGNOSIS — Z1231 Encounter for screening mammogram for malignant neoplasm of breast: Secondary | ICD-10-CM

## 2017-11-18 LAB — COLOGUARD: COLOGUARD: POSITIVE

## 2017-11-26 ENCOUNTER — Encounter: Payer: Self-pay | Admitting: *Deleted

## 2017-12-18 ENCOUNTER — Other Ambulatory Visit: Payer: Self-pay | Admitting: Family Medicine

## 2017-12-18 DIAGNOSIS — H8102 Meniere's disease, left ear: Secondary | ICD-10-CM

## 2017-12-18 DIAGNOSIS — E782 Mixed hyperlipidemia: Secondary | ICD-10-CM

## 2017-12-25 ENCOUNTER — Other Ambulatory Visit (INDEPENDENT_AMBULATORY_CARE_PROVIDER_SITE_OTHER): Payer: Medicare Other

## 2017-12-25 DIAGNOSIS — Z23 Encounter for immunization: Secondary | ICD-10-CM

## 2017-12-29 ENCOUNTER — Other Ambulatory Visit: Payer: Medicare Other

## 2017-12-29 ENCOUNTER — Ambulatory Visit: Payer: Medicare Other

## 2018-01-16 LAB — HM COLONOSCOPY

## 2018-01-23 ENCOUNTER — Ambulatory Visit
Admission: RE | Admit: 2018-01-23 | Discharge: 2018-01-23 | Disposition: A | Discharge status: Home / Self Care | Payer: Medicare Other | Source: Ambulatory Visit | Attending: Family Medicine | Admitting: Family Medicine

## 2018-01-23 DIAGNOSIS — Z1231 Encounter for screening mammogram for malignant neoplasm of breast: Secondary | ICD-10-CM

## 2018-03-24 ENCOUNTER — Other Ambulatory Visit: Payer: Medicare Other

## 2018-04-16 ENCOUNTER — Ambulatory Visit
Admission: RE | Admit: 2018-04-16 | Discharge: 2018-04-16 | Disposition: A | Discharge status: Home / Self Care | Payer: Medicare Other | Source: Ambulatory Visit | Attending: Family Medicine | Admitting: Family Medicine

## 2018-04-16 ENCOUNTER — Other Ambulatory Visit: Payer: Self-pay | Admitting: Family Medicine

## 2018-04-16 DIAGNOSIS — M1A9XX0 Chronic gout, unspecified, without tophus (tophi): Secondary | ICD-10-CM

## 2018-04-16 DIAGNOSIS — E039 Hypothyroidism, unspecified: Secondary | ICD-10-CM

## 2018-04-16 DIAGNOSIS — M8580 Other specified disorders of bone density and structure, unspecified site: Secondary | ICD-10-CM

## 2018-05-14 ENCOUNTER — Other Ambulatory Visit: Payer: Medicare PPO

## 2018-05-14 DIAGNOSIS — E039 Hypothyroidism, unspecified: Secondary | ICD-10-CM

## 2018-05-14 DIAGNOSIS — E782 Mixed hyperlipidemia: Secondary | ICD-10-CM

## 2018-05-14 DIAGNOSIS — I10 Essential (primary) hypertension: Secondary | ICD-10-CM

## 2018-05-15 LAB — COMPREHENSIVE METABOLIC PANEL
ALT: 22 IU/L (ref 0–32)
AST: 17 IU/L (ref 0–40)
Albumin/Globulin Ratio: 2.1 (ref 1.2–2.2)
Albumin: 4.5 g/dL (ref 3.7–4.7)
Alkaline Phosphatase: 33 IU/L — ABNORMAL LOW (ref 39–117)
BUN/Creatinine Ratio: 26 (ref 12–28)
BUN: 23 mg/dL (ref 8–27)
Bilirubin Total: 0.3 mg/dL (ref 0.0–1.2)
CHLORIDE: 107 mmol/L — AB (ref 96–106)
CO2: 22 mmol/L (ref 20–29)
Calcium: 10.9 mg/dL — ABNORMAL HIGH (ref 8.7–10.3)
Creatinine, Ser: 0.89 mg/dL (ref 0.57–1.00)
GFR calc Af Amer: 73 mL/min/{1.73_m2} (ref >59)
GFR calc non Af Amer: 63 mL/min/{1.73_m2} (ref >59)
Globulin, Total: 2.1 g/dL (ref 1.5–4.5)
Glucose: 97 mg/dL (ref 65–99)
Potassium: 4.3 mmol/L (ref 3.5–5.2)
Sodium: 145 mmol/L — ABNORMAL HIGH (ref 134–144)
Total Protein: 6.6 g/dL (ref 6.0–8.5)

## 2018-05-15 LAB — LIPID PANEL
Chol/HDL Ratio: 3.5 ratio (ref 0.0–4.4)
Cholesterol, Total: 140 mg/dL (ref 100–199)
HDL: 40 mg/dL (ref >39)
LDL Calculated: 67 mg/dL (ref 0–99)
Triglycerides: 164 mg/dL — ABNORMAL HIGH (ref 0–149)
VLDL Cholesterol Cal: 33 mg/dL (ref 5–40)

## 2018-05-15 LAB — TSH: TSH: 7.24 u[IU]/mL — ABNORMAL HIGH (ref 0.450–4.500)

## 2018-05-17 NOTE — Progress Notes (Signed)
Chief Complaint  Patient presents with  . Hypertension    nonfasting med check, labs already done. No new concerns. Did mention that for the last 2 weeks she has only been taking 2 fish oils instead of 4.   Patient presents for 6 month follow-up.  Hypertension follow-up: BP's have been running mid-130's/70's. She reports compliance with medications, follow low sodium diet. Denies headaches, dizziness, chest pain, edema, shortness of breath.  Mixed hyperlipidemia: She has been trying to follow a lowfat diet. She only rarely eats fried foods.Still likes her pastas, breads (with butter), but tries to limit it some. She reports compliance with her medications (Crestor, fenofibrate and fish oil), and no side effects. She is taking fish oil 2 BID, but recently has only been taking 2/day when she started running low. She had labs drawn prior to her visit, see below.  Meniere's disease--She istakingfurosemide (and potassium) without side effects or muscle cramps. Hasn't had any flares of vertigo. She has decreased hearing in the left ear and uses a hearing aid. She is under the care of Dr. Suszanne Connerseoh.  Gout--no flare in a few years. No side effects to allopurinol.  GERD: Much better, cannot recall the last time she needed to use Prilosec OTC. Denies dysphagia.  Hypothyroidism: She is compliant with taking her Synthroid on empty stomach, separate from other medications. She never really had any symptoms related to her thyroid. She denies any changes in hair/skin/bowels/moods.Takes her vitamins at night. She denies any missed doses.  Prediabetes--A1c had been up to 5.9 in 04/2015.It has been normal since then.She has tried to cut back on carbs, and walk more.Doesn't drink regular soda, sweet tea. Last 2 sugars have been <100.  Osteopenia:  She takes Caltrate once daily, drinks milk, occasional cheese. She also takes MVI with D.  Some weight-bearing exercise at community center 2x/week.  She had DEXA recentlyi: DEXA 04/2018: The BMD measured at Femur Neck Right is 0.802 g/cm2 with a T-score of -1.7. This patient is considered osteopenic according to World Health Organization [facility](WHO) criteria. There has been no statistically significant change in BMD of total mean since prior exam dated 11/27/2015. She stopped taking the fosamax after getting these results, had been taking since 2013.  PMH, PSH, SH reviewed  Outpatient Encounter Medications as of 05/18/2018  Medication Sig Note  . allopurinol (ZYLOPRIM) 100 MG tablet TAKE 1 TABLET(100 MG) BY MOUTH DAILY   . Calcium-Magnesium-Vitamin D (CITRACAL SLOW RELEASE PO) Take 1 tablet by mouth daily. 10/18/2015: Now taking Caltrate 1200mg  slow release, with Vitamin D  . Coenzyme Q10 (COQ10) 100 MG CAPS Take 100 mg by mouth daily.   . fenofibrate 54 MG tablet TAKE 1 TABLET(54 MG) BY MOUTH DAILY   . furosemide (LASIX) 20 MG tablet TAKE 1 TABLET(20 MG) BY MOUTH DAILY   . losartan (COZAAR) 50 MG tablet Take 1 tablet (50 mg total) by mouth daily.   . Omega-3 Fatty Acids (FISH OIL) 1200 MG CAPS Take 4 capsules by mouth daily.  05/19/2017: 2 qam and 2pqm  . potassium chloride SA (K-DUR,KLOR-CON) 20 MEQ tablet TAKE 1 TABLET(20 MEQ) BY MOUTH DAILY   . rosuvastatin (CRESTOR) 20 MG tablet TAKE 1 TABLET(20 MG) BY MOUTH DAILY   . SYNTHROID 50 MCG tablet TAKE 1 TABLET (50 MCG) DAILY BEFORE BREAKFAST   . timolol (TIMOPTIC) 0.5 % ophthalmic solution INSTILL 1 DROP INTO BOTH EYES QAM UTD 04/13/2015: Received from: External Pharmacy  . Travoprost, BAK Free, (TRAVATAN) 0.004 % SOLN ophthalmic solution  Place 1 drop into both eyes at bedtime.   . hydrocortisone 2.5 % cream Apply to affected areas of skin twice daily as needed for rash/itching (Patient not taking: Reported on 05/19/2017) 11/14/2016: Uses to vaginal area 2x/week prn  . omeprazole (PRILOSEC) 20 MG capsule Take 20 mg by mouth daily. 10/10/2014: Only takes prn  . [DISCONTINUED] alendronate (FOSAMAX) 70  MG tablet TAKE 1 TABLET BY MOUTH EVERY 7 DAYS WITH A FULL GLASS OF WATER AND ON AN EMPTY STOMACH   . [DISCONTINUED] losartan (COZAAR) 50 MG tablet TAKE 1 TABLET(50 MG) BY MOUTH DAILY    Facility-Administered Encounter Medications as of 05/18/2018  Medication  . influenza  inactive virus vaccine (FLUZONE/FLUARIX) injection 0.5 mL   Allergies  Allergen Reactions  . Lisinopril Cough  . Penicillins Rash    ROS:  No fever, chills, URI symptoms, headaches, dizziness, chest pain, shortness of breath, dysphagia, heartburn, thyroid symptoms, bowel changes or other concerns. See HPI.    PHYSICAL EXAM:  BP (!) 148/82   Pulse 80   Ht 5' 0.5" (1.537 m)   Wt 156 lb (70.8 kg)   BMI 29.97 kg/m    130/80 on repeat by MD  Wt Readings from Last 3 Encounters:  05/18/18 156 lb (70.8 kg)  11/12/17 156 lb (70.8 kg)  05/19/17 156 lb 9.6 oz (71 kg)   Well appearing, pleasant female in no distress HEENT: conjunctiva and sclera clear, EOMI, OP clear Neck: no lymphadenopathy or mass, no bruit Heart: regular rate and rhythm Lungs: clear bilaterally Back: no CVA or spinal tenderness Abdomen: soft, nontender, no mass Extremities: no edema, normal pulses Skin: normal turgor, no rash/lesions Psych: normal mood, affect, hygiene and grooming    Chemistry      Component Value Date/Time   NA 145 (H) 05/14/2018 1115   K 4.3 05/14/2018 1115   CL 107 (H) 05/14/2018 1115   CO2 22 05/14/2018 1115   BUN 23 05/14/2018 1115   CREATININE 0.89 05/14/2018 1115   CREATININE 0.95 (H) 11/12/2016 0920      Component Value Date/Time   CALCIUM 10.9 (H) 05/14/2018 1115   ALKPHOS 33 (L) 05/14/2018 1115   AST 17 05/14/2018 1115   ALT 22 05/14/2018 1115   BILITOT 0.3 05/14/2018 1115     Fasting glu 97  Lab Results  Component Value Date   CHOL 140 05/14/2018   HDL 40 05/14/2018   LDLCALC 67 05/14/2018   TRIG 164 (H) 05/14/2018   CHOLHDL 3.5 05/14/2018   Lab Results  Component Value Date   TSH 7.240  (H) 05/14/2018     ASSESSMENT/PLAN:  Essential hypertension, benign - controlled on current regimen. Wt loss recommended  Mixed hyperlipidemia - mildly elevated TG. Cont current regimen, increase fish oil back to 2 BID; lowfat diet reviewed  Hypothyroidism, unspecified type - TSH elevated--on brand, no missed doses or symptoms. Discussed increasing dose vs recheck 3 mos, prefers recheck  Impaired fasting glucose  Osteopenia, unspecified location - alendronate stopped (bone density stable, taking med x 6-7 yrs); Ca elevated--to get from diet, stop vitamin  Meniere's disease of left ear - stable on current regimen  Chronic gout without tophus, unspecified cause, unspecified site - continue allopurinol   TSH is off--??reasons. She is on brand, not generic; no missed pills, is taking correctly. No symptoms. Discussed increasing dose and recheck 6 weeks, vs continuing and recheck 3 mos (sooner if sx). Prefers recheck in 3 mos.  States no refills needed today  3  month lab visit--TSH and Ca, albumin   CPE/AWV 6 months (will schedule lab visit and enter orders after her 3 month labs done)   calcium level was elevated at 10.9.  Stop taking any extra calcium supplements (ie citrical).  Instead, try to get as much calcium as you can from your diet.

## 2018-05-18 ENCOUNTER — Ambulatory Visit: Payer: Medicare PPO | Admitting: Family Medicine

## 2018-05-18 ENCOUNTER — Encounter: Payer: Self-pay | Admitting: Family Medicine

## 2018-05-18 VITALS — BP 130/80 | HR 80 | Ht 60.5 in | Wt 156.0 lb

## 2018-05-18 DIAGNOSIS — E782 Mixed hyperlipidemia: Secondary | ICD-10-CM

## 2018-05-18 DIAGNOSIS — E039 Hypothyroidism, unspecified: Secondary | ICD-10-CM | POA: Diagnosis not present

## 2018-05-18 DIAGNOSIS — I10 Essential (primary) hypertension: Secondary | ICD-10-CM

## 2018-05-18 DIAGNOSIS — H8102 Meniere's disease, left ear: Secondary | ICD-10-CM

## 2018-05-18 DIAGNOSIS — M1A9XX0 Chronic gout, unspecified, without tophus (tophi): Secondary | ICD-10-CM

## 2018-05-18 DIAGNOSIS — R7301 Impaired fasting glucose: Secondary | ICD-10-CM

## 2018-05-18 DIAGNOSIS — M8580 Other specified disorders of bone density and structure, unspecified site: Secondary | ICD-10-CM

## 2018-06-17 ENCOUNTER — Other Ambulatory Visit: Payer: Self-pay | Admitting: Family Medicine

## 2018-06-17 DIAGNOSIS — E039 Hypothyroidism, unspecified: Secondary | ICD-10-CM

## 2018-07-03 ENCOUNTER — Other Ambulatory Visit: Payer: Self-pay | Admitting: Family Medicine

## 2018-07-03 DIAGNOSIS — M1A9XX0 Chronic gout, unspecified, without tophus (tophi): Secondary | ICD-10-CM

## 2018-07-03 DIAGNOSIS — E782 Mixed hyperlipidemia: Secondary | ICD-10-CM

## 2018-07-03 DIAGNOSIS — H8102 Meniere's disease, left ear: Secondary | ICD-10-CM

## 2018-07-03 NOTE — Telephone Encounter (Signed)
Is this ok to refill?  

## 2018-08-17 ENCOUNTER — Other Ambulatory Visit: Payer: Self-pay

## 2018-08-17 ENCOUNTER — Other Ambulatory Visit: Payer: Medicare PPO

## 2018-08-17 DIAGNOSIS — E039 Hypothyroidism, unspecified: Secondary | ICD-10-CM | POA: Diagnosis not present

## 2018-08-18 LAB — TSH: TSH: 5.44 u[IU]/mL — ABNORMAL HIGH (ref 0.450–4.500)

## 2018-08-18 LAB — ALBUMIN: Albumin: 4.5 g/dL (ref 3.7–4.7)

## 2018-08-19 ENCOUNTER — Other Ambulatory Visit: Payer: Self-pay | Admitting: *Deleted

## 2018-08-19 DIAGNOSIS — E039 Hypothyroidism, unspecified: Secondary | ICD-10-CM

## 2018-08-19 LAB — CALCIUM: Calcium: 9.5 mg/dL (ref 8.7–10.3)

## 2018-08-19 MED ORDER — SYNTHROID 75 MCG PO TABS: 75.0000 ug | ORAL_TABLET | Freq: Every day | ORAL | 0 refills | Status: DC

## 2018-08-31 DIAGNOSIS — H2511 Age-related nuclear cataract, right eye: Secondary | ICD-10-CM | POA: Diagnosis not present

## 2018-09-01 DIAGNOSIS — H2512 Age-related nuclear cataract, left eye: Secondary | ICD-10-CM | POA: Diagnosis not present

## 2018-09-14 DIAGNOSIS — H2512 Age-related nuclear cataract, left eye: Secondary | ICD-10-CM | POA: Diagnosis not present

## 2018-10-01 ENCOUNTER — Other Ambulatory Visit: Payer: Self-pay | Admitting: Family Medicine

## 2018-10-01 DIAGNOSIS — I10 Essential (primary) hypertension: Secondary | ICD-10-CM

## 2018-10-01 DIAGNOSIS — H8102 Meniere's disease, left ear: Secondary | ICD-10-CM

## 2018-10-22 ENCOUNTER — Other Ambulatory Visit: Payer: Medicare PPO

## 2018-10-22 ENCOUNTER — Other Ambulatory Visit: Payer: Self-pay

## 2018-10-22 DIAGNOSIS — E039 Hypothyroidism, unspecified: Secondary | ICD-10-CM

## 2018-10-23 LAB — TSH: TSH: 2.93 u[IU]/mL (ref 0.450–4.500)

## 2018-11-18 ENCOUNTER — Telehealth: Payer: Self-pay | Admitting: *Deleted

## 2018-11-18 DIAGNOSIS — E782 Mixed hyperlipidemia: Secondary | ICD-10-CM

## 2018-11-18 DIAGNOSIS — M1A9XX0 Chronic gout, unspecified, without tophus (tophi): Secondary | ICD-10-CM

## 2018-11-18 DIAGNOSIS — Z5181 Encounter for therapeutic drug level monitoring: Secondary | ICD-10-CM

## 2018-11-18 DIAGNOSIS — I10 Essential (primary) hypertension: Secondary | ICD-10-CM

## 2018-11-18 NOTE — Telephone Encounter (Signed)
done

## 2018-11-18 NOTE — Telephone Encounter (Signed)
Patient scheduled for AWV next Wed and would like to come in Tues for labs, need orders please and thanks.

## 2018-11-23 ENCOUNTER — Other Ambulatory Visit: Payer: Self-pay | Admitting: Family Medicine

## 2018-11-24 ENCOUNTER — Other Ambulatory Visit: Payer: Self-pay

## 2018-11-24 ENCOUNTER — Other Ambulatory Visit: Payer: Medicare PPO

## 2018-11-24 DIAGNOSIS — M1A9XX0 Chronic gout, unspecified, without tophus (tophi): Secondary | ICD-10-CM

## 2018-11-24 DIAGNOSIS — Z5181 Encounter for therapeutic drug level monitoring: Secondary | ICD-10-CM

## 2018-11-24 DIAGNOSIS — E782 Mixed hyperlipidemia: Secondary | ICD-10-CM | POA: Diagnosis not present

## 2018-11-24 DIAGNOSIS — I10 Essential (primary) hypertension: Secondary | ICD-10-CM | POA: Diagnosis not present

## 2018-11-24 NOTE — Progress Notes (Signed)
Chief Complaint  Patient presents with  . Medicare Wellness    nonfasting annual exam with pelvic. No concerns.     Kaitlyn Abbott is a 78 y.o. female who presents for annual physical exam, Medicare wellness visit and follow-up on chronic medical conditions. She had labs done prior to her visit, see below.  She noticed a swelling at her right neck this morning.  It isn't painful. No trauma/injury.  Hypertension follow-up: BP's have been running 133-134/70's at home. She reports compliance with medications, follow low sodium diet. Denies headaches, dizziness, chest pain, edema, shortness of breath.  Mixed hyperlipidemia: She has been trying to follow a lowfat diet. She only rarely eats fried foods.Still likes her pastas, no longer having bread with meals (with butter). She reports compliance with her medications (Crestor, fenofibrate and fish oil), and no side effects. She is taking fish oil--cut to 1 BID because bought new fish oil, whose dose was higher (thinks dose may have been 2400??).  Meniere's disease--She istakingfurosemide (and potassium) without side effects or muscle cramps. Hasn't had any flares of vertigo. She has decreased hearing in the left ear and uses a hearing aid. She is under the care ofDr. Benjamine Mola.  Gout--no flare in a few years. No side effects to allopurinol.  H/o GERD: Much better, cannot recall the last time she needed to use Prilosec OTC. Denies dysphagia. Maybe took it once or twice since last visit here.  Hypothyroidism: She is compliant with taking her Synthroid on empty stomach, separate from other medications. She never really had any symptoms related to her thyroid. She denies any changes in hair/skin/bowels/moods.Takes her vitamins at night. She denies any missed doses. TSH was normal on last check. Lab Results  Component Value Date   TSH 2.930 10/22/2018   Prediabetes--A1c had been up to 5.9 in 04/2015.Mamie Nick been normal since then.She has  tried to cut back on carbs, and walk more.Doesn't drink regular soda, sweet tea.  Osteopenia: She drinks milk, occasional cheese. She has Activia yogurt once or twice daily. She stopped taking MVI (reports she was ONLY taking MVI, that had Ca, wasn't taking a separate Caltrate), due to her calcium level being high on 05/2018 Prior to COVID was getting weight-bearing exercise at community center 2x/week.  Hasn't been using her dumbbells at home. DEXA 04/2018: The BMD measured at Femur Neck Right is 0.802 g/cm2 with a T-score of -1.7. This patient is considered osteopenic according to [facility]) criteria. There has been no statistically significant change in BMD of total mean since prior exam dated 11/27/2015. She stopped taking the fosamax after getting these results, had been taking since 2013.    Immunization History  Administered Date(s) Administered  . Influenza Split 11/29/2010, 01/14/2012  . Influenza, High Dose Seasonal PF 01/26/2013, 12/30/2013, 01/12/2015, 01/17/2016, 01/21/2017, 12/25/2017  . Pneumococcal Conjugate-13 10/06/2013  . Pneumococcal Polysaccharide-23 01/24/2010  . Tdap 03/23/2012  . Zoster 04/21/2015   Last Pap smear: 09/2015, normal, with no high risk HPV Last mammogram:12/2017 Last colonoscopy: 12/2017 Dr. Watt Climes; int/ext hemorrhoids, diverticulosis (she had +Cologuard 10/2017) Last DEXA: 04/2018 T-1.7 R fem neck (unchanged from 2017) Dentist: regularly, twice yearly (more due to recent [facility], implants, which had to be removed). Ophtho: yearly Exercise: walks with a friend 3-4 days/week (every other day), for 45-60 mins (2.5-3 miles). Hasn't been using her handweights.  (used to go to community center 2x/week). AAA screen negative 09/2015  Other doctors caring for patient include: Ophtho: Dr. Laban Emperor at [facility] (  and previously Dr. Tommy Rainwater) Dentist: Dr.Saraf Periodontist: Dr. Satira Sark Ortho: Dr. Noemi Chapel GI: Dr. Watt Climes Derm: Dr.  Derrel Nip ([place]) Podiatrist: Dr. Paulla Dolly ENT: Dr. Benjamine Mola   Depression screen:Negative Functional Status survey:Notable for hearing loss in L ear, unchanged(related to meniere's).  Fall screen is negative. Mini-Cog screen: normal (5)  See full screens in Epic  End of Life Discussion: Patient has a living will and medical power of attorney; plans to make changes and get [place] copies. Hasn't made the changes yet (it is at bank in safe deposit box).  Past Medical History:  Diagnosis Date  . Gout attack 2011  . Hypertension   . Hypothyroid   . Impaired fasting glucose   . Low tension glaucoma    [facility]  . Meniere disease   . Mixed hyperlipidemia   . Osteopenia   . Vitamin D deficiency     Past Surgical History:  Procedure Laterality Date  . GANGLION CYST EXCISION  bilateral, 20 years apart   bilateral wrists  . ROTATOR CUFF REPAIR  10/2010   Right (Dr. Noemi Chapel)    Social History   Socioeconomic History  . Marital status: Married    Spouse name: Not on file  . Number of children: 2  . Years of education: Not on file  . Highest education level: Not on file  Occupational History  . Occupation: retired Surveyor, quantity, and Veterinary surgeon)  Social Needs  . Financial resource strain: Not on file  . Food insecurity    Worry: Not on file    Inability: Not on file  . Transportation needs    Medical: Not on file    Non-medical: Not on file  Tobacco Use  . Smoking status: Never Smoker  . Smokeless tobacco: Never Used  Substance and Sexual Activity  . Alcohol use: Yes    Comment: 3 drinks a month or less  . Drug use: No  . Sexual activity: Yes    Partners: Male  Lifestyle  . Physical activity    Days per week: Not on file    Minutes per session: Not on file  . Stress: Not on file  Relationships  . Social Herbalist on phone: Not on file    Gets together: Not on file    Attends religious service: Not on file    Active member of club or  organization: Not on file    Attends meetings of clubs or organizations: Not on file    Relationship status: Not on file  . Intimate partner violence    Fear of current or ex partner: Not on file    Emotionally abused: Not on file    Physically abused: Not on file    Forced sexual activity: Not on file  Other Topics Concern  . Not on file  Social History Narrative   Lives with husband.  Daughter lives in [place], son moved from [place] to [place] (works in [place], [place]).  No pets.  Daughter is IT consultant.     Family History  Problem Relation Age of Onset  . Diabetes Mother   . Hyperlipidemia Mother   . Hypertension Mother   . Arthritis Mother   . Hypothyroidism Mother   . Aortic aneurysm Mother 15       repaired after it started leaking; thoracic (and small renal)  . Cancer Father        kidney CA, and bone CA  . Anxiety disorder Sister   .  Hyperlipidemia Sister   . Hypertension Sister   . Alcohol abuse Sister   . Hyperlipidemia Daughter   . Crohn's disease Daughter   . Hyperlipidemia Son   . Arthritis Maternal Grandmother   . Heart disease Maternal Grandmother   . Diabetes Maternal Grandfather   . Heart disease Maternal Grandfather   . Stroke Maternal Grandfather   . Breast cancer Neg Hx     Outpatient Encounter Medications as of 11/25/2018  Medication Sig Note  . allopurinol (ZYLOPRIM) 100 MG tablet TAKE 1 TABLET(100 MG) BY MOUTH DAILY   . Coenzyme Q10 (COQ10) 100 MG CAPS Take 100 mg by mouth daily.   . fenofibrate 54 MG tablet TAKE 1 TABLET(54 MG) BY MOUTH DAILY   . furosemide (LASIX) 20 MG tablet TAKE 1 TABLET(20 MG) BY MOUTH DAILY   . hydrocortisone 2.5 % cream Apply to affected areas of skin twice daily as needed for rash/itching 11/14/2016: Uses to vaginal area 2x/week prn  . losartan (COZAAR) 50 MG tablet TAKE 1 TABLET(50 MG) BY MOUTH DAILY   . Omega-3 Fatty Acids (FISH OIL) 1200 MG CAPS Take 4 capsules by mouth daily.  11/25/2018: Changed to stronger pill  (unsure of dose), taking 1 BID  . potassium chloride SA (K-DUR,KLOR-CON) 20 MEQ tablet TAKE 1 TABLET(20 MEQ) BY MOUTH DAILY   . rosuvastatin (CRESTOR) 20 MG tablet TAKE 1 TABLET(20 MG) BY MOUTH DAILY   . SYNTHROID 75 MCG tablet TAKE 1 TABLET (75 MCG TOTAL) DAILY BEFORE BREAKFAST.   . Travoprost, BAK Free, (TRAVATAN) 0.004 % SOLN ophthalmic solution Place 1 drop into both eyes at bedtime.   Marland Kitchen omeprazole (PRILOSEC) 20 MG capsule Take 20 mg by mouth daily. 10/10/2014: Only takes prn  . [DISCONTINUED] Calcium-Magnesium-Vitamin D (CITRACAL SLOW RELEASE PO) Take 1 tablet by mouth daily. 10/18/2015: Now taking Caltrate 1270m slow release, with Vitamin D  . [DISCONTINUED] SYNTHROID 75 MCG tablet Take 1 tablet (75 mcg total) by mouth daily before breakfast.   . [DISCONTINUED] timolol (TIMOPTIC) 0.5 % ophthalmic solution INSTILL 1 DROP INTO BOTH EYES QAM UTD 04/13/2015: Received from: External Pharmacy   Facility-Administered Encounter Medications as of 11/25/2018  Medication  . influenza  inactive virus vaccine (FLUZONE/FLUARIX) injection 0.5 mL    Allergies  Allergen Reactions  . Lisinopril Cough  . Penicillins Rash    ROS: The patient denies anorexia, fever, headaches; vision changes, decreased hearing (diminished on left, chronic/unchanged), ear pain, sore throat, breast concerns, chest pain, palpitations, dizziness, syncope, dyspnea on exertion, cough, swelling, nausea, vomiting, diarrhea, constipation, abdominal pain, melena, hematochezia, indigestion/heartburn (rare, about once a month), hematuria, incontinence, dysuria, vaginal bleeding, discharge, or odor;slight external vaginal itchis controlled with hydrocortisone cream sporadically (relieved by topical cream);denies genital lesions, numbness, tingling, weakness, tremor, depression, anxiety, abnormal bleeding/bruising, or enlarged lymph nodes.  +arthritis in her hands, stiff in the morning.  No gout flares.    PHYSICAL EXAM:  BP 138/82    Pulse 84   Temp 98.4 F (36.9 C) (Other (Comment))   Ht '5\' 1"'  (1.549 m)   Wt 156 lb 6.4 oz (70.9 kg)   BMI 29.55 kg/m   BP 122/68 on repeat by MD  Wt Readings from Last 3 Encounters:  11/25/18 156 lb 6.4 oz (70.9 kg)  05/18/18 156 lb (70.8 kg)  11/12/17 156 lb (70.8 kg)    General Appearance:  Alert, cooperative, no distress, appears stated age   Head:  Normocephalic, without obvious abnormality, atraumatic   Eyes:  PERRL, conjunctiva/corneas clear, EOM's  intact, fundi benign   Ears:  Normal TM and external ear canalon R, L not examined, wearing hearing aid  Nose:  Not examined (wearing mask due to COVID-19 pandemic)  Throat:  Not examined (wearing mask due to COVID-19 pandemic)  Neck:  Supple, no lymphadenopathy; thyroid: no enlargement/ tenderness/nodules; no carotid bruit or JVD.  There is a diffuse, vague, soft tissue swelling in the right supraclavicular area.  No lymphadenopathy, erythema or tenderness, very soft.  Back:  Spine nontender, no curvature, ROM normal, no CVA tenderness   Lungs:  Clear to auscultation bilaterally without wheezes, rales or ronchi; respirations unlabored   Chest Wall:  No tenderness or deformity   Heart:  Regular rate and rhythm, S1 and S2 normal, no murmur, rub or gallop   Breast Exam:  No tenderness, masses, or nipple discharge or inversion. No axillary lymphadenopathy   Abdomen:  Soft, non-tender, nondistended, normoactive bowel sounds, no masses, no hepatosplenomegaly   Genitalia:  Normal external genitalia without lesions. Mild atrophic changes, and some slight thickening (mild, diffuse)of external genitalia/labia majora noted.BUS and vagina normal; no cervical motion tenderness. No abnormal vaginal discharge. Uterus and adnexa not enlarged, nontender, no masses. Pap notperformed   Rectal:  Normal tone, no masses or tenderness; guaiac negative stool   Extremities:  No clubbing, cyanosis or  edema.   Pulses:  2+ and symmetric all extremities   Skin:  Skin color, texture, turgor normal, no rashes.   Lymph nodes:  Cervical, supraclavicular, and axillary nodes normal   Neurologic:  CNII-XII intact, normal strength, sensation and gait; reflexes 2+ and symmetric throughout    Psych: Normal mood, affect, hygiene and grooming     Chemistry      Component Value Date/Time   NA 138 11/24/2018 0830   K 4.5 11/24/2018 0830   CL 102 11/24/2018 0830   CO2 22 11/24/2018 0830   BUN 21 11/24/2018 0830   CREATININE 0.99 11/24/2018 0830   CREATININE 0.95 (H) 11/12/2016 0920      Component Value Date/Time   CALCIUM 9.4 11/24/2018 0830   ALKPHOS 39 11/24/2018 0830   AST 20 11/24/2018 0830   ALT 19 11/24/2018 0830   BILITOT 0.5 11/24/2018 0830     Fasting glucose 91  Lab Results  Component Value Date   LABURIC 5.6 11/24/2018   Lab Results  Component Value Date   CHOL 136 11/24/2018   HDL 37 (L) 11/24/2018   LDLCALC 64 11/24/2018   TRIG 173 (H) 11/24/2018   CHOLHDL 3.7 11/24/2018   Lab Results  Component Value Date   WBC 5.6 11/24/2018   HGB 13.6 11/24/2018   HCT 42.7 11/24/2018   MCV 93 11/24/2018   PLT 197 11/24/2018    ASSESSMENT/PLAN:  Annual physical exam  Medicare annual wellness visit, subsequent  Essential hypertension, benign - well controlled  Mixed hyperlipidemia - elevated TG--reviewed diet, omega-3 dose recommendation. Cont fenofibrate and statin  Chronic gout without tophus, unspecified cause, unspecified site - cont allopurinol  Hypothyroidism, unspecified type  Osteopenia, unspecified location - reminded to do weight-bearing exercise. Discussed Ca through diet. Rec starting vitamin D 1000 IU daily since MVI was stopped  Meniere's disease of left ear - stable, controlled on current regimen  Neck swelling - R sided, soft tissue, in supraclavicular area, no adenopathy noted.  Only of 1 day duration. Needs  further eval if persists/worsens   High dose flu shot offered today--prefers to wait until October (I recommended September).  Discussed monthly self breast  exams and yearly mammograms; at least 30 minutes of aerobic activity at least 5 days/week, weight-bearing exercise at least 2x/wk; proper sunscreen use reviewed; healthy diet, including goals of calcium and vitamin D intake and alcohol recommendations (less than or equal to 1 drink/day) reviewed; regular seatbelt use; changing batteries in smoke detectors. Immunization recommendations discussed--continueyearly high dose flu shots.Shingrix recommended, risks/side effects reviewed, to get from pharmacy.Colonoscopy recommendations reviewed--UTD.  Repeat DEXA 04/2020 (2 years after stopping alendronate)  MOST form reviewed and updated. Full code, Full Care  F/u 6 months with fasting labs prior Lipids, TSH, c-met  Medicare Attestation I have personally reviewed: The patient's medical and social history Their use of alcohol, tobacco or illicit drugs Their current medications and supplements The patient's functional ability including ADLs,fall risks, home safety risks, cognitive, and hearing and visual impairment Diet and physical activities Evidence for depression or mood disorders  The patient's weight, height, BMI, and visual acuity have been recorded in the chart.  I have made referrals, counseling, and provided education to the patient based on review of the above and I have provided the patient with a written personalized care plan for preventive services.

## 2018-11-24 NOTE — Patient Instructions (Addendum)
  HEALTH MAINTENANCE RECOMMENDATIONS:  It is recommended that you get at least 30 minutes of aerobic exercise at least 5 days/week (for weight loss, you may need as much as 60-90 minutes). This can be any activity that gets your heart rate up. This can be divided in 10-15 minute intervals if needed, but try and build up your endurance at least once a week.  Weight bearing exercise is also recommended twice weekly.  Eat a healthy diet with lots of vegetables, fruits and fiber.  "Colorful" foods have a lot of vitamins (ie green vegetables, tomatoes, red peppers, etc).  Limit sweet tea, regular sodas and alcoholic beverages, all of which has a lot of calories and sugar.  Up to 1 alcoholic drink daily may be beneficial for women (unless trying to lose weight, watch sugars).  Drink a lot of water.  Calcium recommendations are 1200-1500 mg daily (1500 mg for postmenopausal women or women without ovaries), and vitamin D 1000 IU daily.  This should be obtained from diet and/or supplements (vitamins), and calcium should not be taken all at once, but in divided doses.  Monthly self breast exams and yearly mammograms for women over the age of 10 is recommended.  Sunscreen of at least SPF 30 should be used on all sun-exposed parts of the skin when outside between the hours of 10 am and 4 pm (not just when at beach or pool, but even with exercise, golf, tennis, and yard work!)  Use a sunscreen that says "broad spectrum" so it covers both UVA and UVB rays, and make sure to reapply every 1-2 hours.  Remember to change the batteries in your smoke detectors when changing your clock times in the spring and fall. Carbon monoxide detectors are recommended for your home.  Use your seat belt every time you are in a car, and please drive safely and not be distracted with cell phones and texting while driving.   Ms. Bucknam , Thank you for taking time to come for your Medicare Wellness Visit. I appreciate your ongoing  commitment to your health goals. Please review the following plan we discussed and let me know if I can assist you in the future.   This is a list of the screening recommended for you and due dates:  Health Maintenance  Topic Date Due  . Flu Shot  10/31/2018  . Tetanus Vaccine  03/23/2022  . DEXA scan (bone density measurement)  Completed  . Pneumonia vaccines  Completed   Next bone density test will be due 04/2020 (2 years after stopping the alendronate).  I recommend getting the new shingles vaccine (Shingrix). You will need to get this from the pharmacy, as it is covered by Medicare part D.  It is a series of 2 injections, spaced 2 months apart.  Start taking vitamin D 1000 IU daily.

## 2018-11-25 ENCOUNTER — Encounter: Payer: Self-pay | Admitting: Family Medicine

## 2018-11-25 ENCOUNTER — Ambulatory Visit (INDEPENDENT_AMBULATORY_CARE_PROVIDER_SITE_OTHER): Payer: Medicare PPO | Admitting: Family Medicine

## 2018-11-25 VITALS — BP 122/68 | HR 84 | Temp 98.4°F | Ht 61.0 in | Wt 156.4 lb

## 2018-11-25 DIAGNOSIS — Z5181 Encounter for therapeutic drug level monitoring: Secondary | ICD-10-CM

## 2018-11-25 DIAGNOSIS — R221 Localized swelling, mass and lump, neck: Secondary | ICD-10-CM

## 2018-11-25 DIAGNOSIS — Z0000 Encounter for general adult medical examination without abnormal findings: Secondary | ICD-10-CM

## 2018-11-25 DIAGNOSIS — H8102 Meniere's disease, left ear: Secondary | ICD-10-CM | POA: Diagnosis not present

## 2018-11-25 DIAGNOSIS — E782 Mixed hyperlipidemia: Secondary | ICD-10-CM

## 2018-11-25 DIAGNOSIS — I10 Essential (primary) hypertension: Secondary | ICD-10-CM

## 2018-11-25 DIAGNOSIS — M8580 Other specified disorders of bone density and structure, unspecified site: Secondary | ICD-10-CM

## 2018-11-25 DIAGNOSIS — M1A9XX0 Chronic gout, unspecified, without tophus (tophi): Secondary | ICD-10-CM | POA: Diagnosis not present

## 2018-11-25 DIAGNOSIS — E039 Hypothyroidism, unspecified: Secondary | ICD-10-CM

## 2018-11-25 LAB — COMPREHENSIVE METABOLIC PANEL
ALT: 19 IU/L (ref 0–32)
AST: 20 IU/L (ref 0–40)
Albumin/Globulin Ratio: 2 (ref 1.2–2.2)
Albumin: 4.3 g/dL (ref 3.7–4.7)
Alkaline Phosphatase: 39 IU/L (ref 39–117)
BUN/Creatinine Ratio: 21 (ref 12–28)
BUN: 21 mg/dL (ref 8–27)
Bilirubin Total: 0.5 mg/dL (ref 0.0–1.2)
CO2: 22 mmol/L (ref 20–29)
Calcium: 9.4 mg/dL (ref 8.7–10.3)
Chloride: 102 mmol/L (ref 96–106)
Creatinine, Ser: 0.99 mg/dL (ref 0.57–1.00)
GFR calc Af Amer: 64 mL/min/{1.73_m2} (ref >59)
GFR calc non Af Amer: 55 mL/min/{1.73_m2} — ABNORMAL LOW (ref >59)
Globulin, Total: 2.1 g/dL (ref 1.5–4.5)
Glucose: 91 mg/dL (ref 65–99)
Potassium: 4.5 mmol/L (ref 3.5–5.2)
Sodium: 138 mmol/L (ref 134–144)
Total Protein: 6.4 g/dL (ref 6.0–8.5)

## 2018-11-25 LAB — CBC WITH DIFFERENTIAL/PLATELET
Basophils Absolute: 0.1 10*3/uL (ref 0.0–0.2)
Basos: 1 %
EOS (ABSOLUTE): 0.3 10*3/uL (ref 0.0–0.4)
Eos: 5 %
Hematocrit: 42.7 % (ref 34.0–46.6)
Hemoglobin: 13.6 g/dL (ref 11.1–15.9)
Immature Grans (Abs): 0 10*3/uL (ref 0.0–0.1)
Immature Granulocytes: 1 %
Lymphocytes Absolute: 2.1 10*3/uL (ref 0.7–3.1)
Lymphs: 38 %
MCH: 29.6 pg (ref 26.6–33.0)
MCHC: 31.9 g/dL (ref 31.5–35.7)
MCV: 93 fL (ref 79–97)
Monocytes Absolute: 0.5 10*3/uL (ref 0.1–0.9)
Monocytes: 10 %
Neutrophils Absolute: 2.5 10*3/uL (ref 1.4–7.0)
Neutrophils: 45 %
Platelets: 197 10*3/uL (ref 150–450)
RBC: 4.6 x10E6/uL (ref 3.77–5.28)
RDW: 13.2 % (ref 11.7–15.4)
WBC: 5.6 10*3/uL (ref 3.4–10.8)

## 2018-11-25 LAB — LIPID PANEL
Chol/HDL Ratio: 3.7 ratio (ref 0.0–4.4)
Cholesterol, Total: 136 mg/dL (ref 100–199)
HDL: 37 mg/dL — ABNORMAL LOW (ref >39)
LDL Calculated: 64 mg/dL (ref 0–99)
Triglycerides: 173 mg/dL — ABNORMAL HIGH (ref 0–149)
VLDL Cholesterol Cal: 35 mg/dL (ref 5–40)

## 2018-11-25 LAB — URIC ACID: Uric Acid: 5.6 mg/dL (ref 2.5–7.1)

## 2018-12-14 ENCOUNTER — Telehealth: Payer: Self-pay | Admitting: Family Medicine

## 2018-12-14 DIAGNOSIS — R221 Localized swelling, mass and lump, neck: Secondary | ICD-10-CM

## 2018-12-14 NOTE — Telephone Encounter (Signed)
Scheduled for 12/22/2018 @ 1pm. Will get PA on Wed.

## 2018-12-14 NOTE — Telephone Encounter (Signed)
PA done

## 2018-12-14 NOTE — Telephone Encounter (Signed)
Spoke with pt.  Swelling of right neck has persisted, reports it moved a little further down, above her clavicle.  As discussed at visit, if persisted, imaging recommended.  Ordered CT soft tissue of neck to further evaluate right sided neck/supraclavicular swelling.

## 2018-12-14 NOTE — Telephone Encounter (Signed)
   Please call   She wanted to let you know that    spot on neck/sweliing still there

## 2018-12-22 ENCOUNTER — Encounter: Payer: Self-pay | Admitting: Family Medicine

## 2018-12-22 ENCOUNTER — Ambulatory Visit
Admission: RE | Admit: 2018-12-22 | Discharge: 2018-12-22 | Disposition: A | Discharge status: Home / Self Care | Payer: Medicare PPO | Source: Ambulatory Visit | Attending: Family Medicine | Admitting: Family Medicine

## 2018-12-22 DIAGNOSIS — I6529 Occlusion and stenosis of unspecified carotid artery: Secondary | ICD-10-CM | POA: Insufficient documentation

## 2018-12-22 DIAGNOSIS — R221 Localized swelling, mass and lump, neck: Secondary | ICD-10-CM | POA: Diagnosis not present

## 2018-12-22 MED ORDER — IOPAMIDOL (ISOVUE-300) INJECTION 61%
75.0000 mL | Freq: Once | INTRAVENOUS | Status: AC | PRN
  Administered 2018-12-22: 75 mL via INTRAVENOUS

## 2018-12-23 ENCOUNTER — Other Ambulatory Visit: Payer: Self-pay | Admitting: *Deleted

## 2018-12-23 DIAGNOSIS — I6529 Occlusion and stenosis of unspecified carotid artery: Secondary | ICD-10-CM

## 2018-12-24 ENCOUNTER — Other Ambulatory Visit: Payer: Self-pay | Admitting: Family Medicine

## 2018-12-24 ENCOUNTER — Other Ambulatory Visit (INDEPENDENT_AMBULATORY_CARE_PROVIDER_SITE_OTHER): Payer: Medicare PPO

## 2018-12-24 ENCOUNTER — Other Ambulatory Visit: Payer: Self-pay

## 2018-12-24 DIAGNOSIS — Z23 Encounter for immunization: Secondary | ICD-10-CM | POA: Diagnosis not present

## 2018-12-24 DIAGNOSIS — Z1231 Encounter for screening mammogram for malignant neoplasm of breast: Secondary | ICD-10-CM

## 2018-12-29 ENCOUNTER — Ambulatory Visit
Admission: RE | Admit: 2018-12-29 | Discharge: 2018-12-29 | Disposition: A | Discharge status: Home / Self Care | Payer: Medicare PPO | Source: Ambulatory Visit | Attending: Family Medicine | Admitting: Family Medicine

## 2018-12-29 DIAGNOSIS — I6529 Occlusion and stenosis of unspecified carotid artery: Secondary | ICD-10-CM

## 2018-12-29 DIAGNOSIS — I6523 Occlusion and stenosis of bilateral carotid arteries: Secondary | ICD-10-CM | POA: Diagnosis not present

## 2018-12-30 ENCOUNTER — Other Ambulatory Visit: Payer: Self-pay | Admitting: Family Medicine

## 2018-12-30 DIAGNOSIS — E782 Mixed hyperlipidemia: Secondary | ICD-10-CM

## 2018-12-30 DIAGNOSIS — M1A9XX0 Chronic gout, unspecified, without tophus (tophi): Secondary | ICD-10-CM

## 2019-01-29 ENCOUNTER — Ambulatory Visit
Admission: RE | Admit: 2019-01-29 | Discharge: 2019-01-29 | Disposition: A | Discharge status: Home / Self Care | Payer: Medicare PPO | Source: Ambulatory Visit | Attending: Family Medicine | Admitting: Family Medicine

## 2019-01-29 ENCOUNTER — Other Ambulatory Visit: Payer: Self-pay

## 2019-01-29 DIAGNOSIS — Z1231 Encounter for screening mammogram for malignant neoplasm of breast: Secondary | ICD-10-CM

## 2019-02-19 DIAGNOSIS — H8102 Meniere's disease, left ear: Secondary | ICD-10-CM | POA: Diagnosis not present

## 2019-02-19 DIAGNOSIS — H903 Sensorineural hearing loss, bilateral: Secondary | ICD-10-CM | POA: Diagnosis not present

## 2019-02-22 ENCOUNTER — Other Ambulatory Visit: Payer: Self-pay | Admitting: *Deleted

## 2019-02-22 DIAGNOSIS — I10 Essential (primary) hypertension: Secondary | ICD-10-CM

## 2019-02-22 MED ORDER — LOSARTAN POTASSIUM 50 MG PO TABS: ORAL_TABLET | ORAL | 0 refills | Status: DC

## 2019-03-21 ENCOUNTER — Other Ambulatory Visit: Payer: Self-pay | Admitting: Family Medicine

## 2019-03-21 DIAGNOSIS — E782 Mixed hyperlipidemia: Secondary | ICD-10-CM

## 2019-03-22 ENCOUNTER — Encounter: Payer: Self-pay | Admitting: Family Medicine

## 2019-04-20 ENCOUNTER — Other Ambulatory Visit: Payer: Self-pay | Admitting: Family Medicine

## 2019-05-02 ENCOUNTER — Ambulatory Visit: Payer: Medicare PPO

## 2019-05-07 ENCOUNTER — Ambulatory Visit: Payer: Medicare PPO

## 2019-05-19 ENCOUNTER — Other Ambulatory Visit: Payer: Self-pay | Admitting: Family Medicine

## 2019-05-19 DIAGNOSIS — I10 Essential (primary) hypertension: Secondary | ICD-10-CM

## 2019-05-19 DIAGNOSIS — H8102 Meniere's disease, left ear: Secondary | ICD-10-CM

## 2019-05-23 ENCOUNTER — Ambulatory Visit: Payer: Medicare PPO

## 2019-05-27 ENCOUNTER — Other Ambulatory Visit: Payer: Self-pay

## 2019-05-27 ENCOUNTER — Other Ambulatory Visit: Payer: Medicare PPO

## 2019-05-27 DIAGNOSIS — Z5181 Encounter for therapeutic drug level monitoring: Secondary | ICD-10-CM

## 2019-05-27 DIAGNOSIS — E782 Mixed hyperlipidemia: Secondary | ICD-10-CM | POA: Diagnosis not present

## 2019-05-27 DIAGNOSIS — E039 Hypothyroidism, unspecified: Secondary | ICD-10-CM

## 2019-05-27 DIAGNOSIS — I10 Essential (primary) hypertension: Secondary | ICD-10-CM

## 2019-05-28 LAB — COMPREHENSIVE METABOLIC PANEL
ALT: 21 IU/L (ref 0–32)
AST: 19 IU/L (ref 0–40)
Albumin/Globulin Ratio: 2.1 (ref 1.2–2.2)
Albumin: 4.4 g/dL (ref 3.7–4.7)
Alkaline Phosphatase: 41 IU/L (ref 39–117)
BUN/Creatinine Ratio: 16 (ref 12–28)
BUN: 18 mg/dL (ref 8–27)
Bilirubin Total: 0.5 mg/dL (ref 0.0–1.2)
CO2: 21 mmol/L (ref 20–29)
Calcium: 9.8 mg/dL (ref 8.7–10.3)
Chloride: 102 mmol/L (ref 96–106)
Creatinine, Ser: 1.15 mg/dL — ABNORMAL HIGH (ref 0.57–1.00)
GFR calc Af Amer: 53 mL/min/{1.73_m2} — ABNORMAL LOW (ref >59)
GFR calc non Af Amer: 46 mL/min/{1.73_m2} — ABNORMAL LOW (ref >59)
Globulin, Total: 2.1 g/dL (ref 1.5–4.5)
Glucose: 95 mg/dL (ref 65–99)
Potassium: 4.4 mmol/L (ref 3.5–5.2)
Sodium: 140 mmol/L (ref 134–144)
Total Protein: 6.5 g/dL (ref 6.0–8.5)

## 2019-05-28 LAB — LIPID PANEL
Chol/HDL Ratio: 3.6 ratio (ref 0.0–4.4)
Cholesterol, Total: 133 mg/dL (ref 100–199)
HDL: 37 mg/dL — ABNORMAL LOW (ref >39)
LDL Chol Calc (NIH): 69 mg/dL (ref 0–99)
Triglycerides: 154 mg/dL — ABNORMAL HIGH (ref 0–149)
VLDL Cholesterol Cal: 27 mg/dL (ref 5–40)

## 2019-05-28 LAB — TSH: TSH: 2.16 u[IU]/mL (ref 0.450–4.500)

## 2019-05-30 DIAGNOSIS — I6523 Occlusion and stenosis of bilateral carotid arteries: Secondary | ICD-10-CM | POA: Insufficient documentation

## 2019-05-30 NOTE — Progress Notes (Signed)
Chief Complaint  Patient presents with  . Hypertension    nonfasting (labs already done) med check, no concerns.     Patient presents for 6 month follow-up. She had labs done prior to visit, see below.  Hypertension follow-up: BP's have been running high 130's to low 140's/69-70's. She reports compliance with medications (losartan 48m daily; also on lasix for meniere's), follow low sodium diet. Denies headaches, dizziness, chest pain, edema, shortness of breath.  Mixed hyperlipidemia: She has been trying to follow a lowfat diet. She only rarely eats fried foods.  She reports compliance with her medications (Crestor, fenofibrate and fish oil), and has no side effects.   Meniere's disease--She istakingfurosemide (and potassium) without side effects or muscle cramps. Hasn't had any flares of vertigo. She has decreased hearing in the left ear and uses a hearing aid. She is under the care ofDr. TBenjamine Mola  Gout--no flare in a few years. No side effects to allopurinol.  Hypothyroidism: She is compliant with taking her Synthroid on empty stomach, separate from other medications. She never really had any symptoms related to her thyroid. She denies any changes in hair/skin/bowels/moods. She denies any missed doses.   Prediabetes--A1c had been up to 5.9 in 04/2015.IMamie Nickbeen normal since then.She has tried to cut back on carbs, and walk more.Doesn't drink regular soda, sweet tea.  Osteopenia: She took alendronate from 2013 until her last DEXA in 04/2018 (T-1.7 R fem neck).  She drinks milk, occasional cheese. She has Activia yogurt once or twice daily.  She is taking vitamin D 1000 IU daily.  Sporadic weight-bearing exercise ([place] to use gym [place]  She was noted to have some fullness at R supraclavicular area at her last visit.  She thinks this is slightly less noticeable than last time. She underwent [place] neck 12/2018: IMPRESSION: Negative for  soft tissue mass or adenopathy. Palpable right supraclavicular region may be related to subcutaneous fat deposition which is mildly asymmetric on the right.  Carotid UKoreadone 12/2018: IMPRESSION: Moderate to large amount of bilateral atherosclerotic plaque, not resulting in a hemodynamically significant stenosis within either internal carotid artery.  She was started on ECASA 850mafter these results, already on statin.   PMH, PSH, SH reviewed  Outpatient Encounter Medications as of 05/31/2019  Medication Sig Note  . allopurinol (ZYLOPRIM) 100 MG tablet TAKE 1 TABLET(100 MG) BY MOUTH DAILY   . aspirin EC 81 MG tablet Take 81 mg by mouth daily.   . cholecalciferol (VITAMIN D3) 25 MCG (1000 UNIT) tablet Take 1,000 Units by mouth daily.   . Coenzyme Q10 (COQ10) 100 MG CAPS Take 100 mg by mouth daily.   . fenofibrate 54 MG tablet TAKE 1 TABLET(54 MG) BY MOUTH DAILY   . furosemide (LASIX) 20 MG tablet TAKE 1 TABLET(20 MG) BY MOUTH DAILY   . hydrocortisone 2.5 % cream Apply to affected areas of skin twice daily as needed for rash/itching 11/14/2016: Uses to vaginal area 2x/week prn  . losartan (COZAAR) 50 MG tablet TAKE 1 TABLET(50 MG) BY MOUTH DAILY   . Omega-3 Fatty Acids (FISH OIL) 1200 MG CAPS Take 4 capsules by mouth daily.    . Marland Kitchenmeprazole (PRILOSEC) 20 MG capsule Take 20 mg by mouth daily. 10/10/2014: Only takes prn  . potassium chloride SA (K-DUR,KLOR-CON) 20 MEQ tablet TAKE 1 TABLET(20 MEQ) BY MOUTH DAILY   . rosuvastatin (CRESTOR) 20 MG tablet TAKE 1 TABLET(20 MG) BY MOUTH DAILY   .  SYNTHROID 75 MCG tablet TAKE 1 TABLET (75 MCG TOTAL) DAILY BEFORE BREAKFAST.   . Travoprost, BAK Free, (TRAVATAN) 0.004 % SOLN ophthalmic solution Place 1 drop into both eyes at bedtime.    Facility-Administered Encounter Medications as of 05/31/2019  Medication  . influenza  inactive virus vaccine (FLUZONE/FLUARIX) injection 0.5 mL   Allergies  Allergen Reactions  . Lisinopril Cough  . Penicillins Rash     ROS: no fever, chills, headaches, dizziness, URI symptoms, cough, shortness of breath, chest pain, palpitations.  Denies urinary complaints, bleeding, bruising, rash. No thyroid symptoms. Moods are good.   PHYSICAL EXAM:  BP 140/80   Pulse 80   Temp (!) 96.3 F (35.7 C) (Other (Comment))   Ht '5\' 1"'  (1.549 m)   Wt 157 lb 6.4 oz (71.4 kg)   BMI 29.74 kg/m    Wt Readings from Last 3 Encounters:  11/25/18 156 lb 6.4 oz (70.9 kg)  05/18/18 156 lb (70.8 kg)  11/12/17 156 lb (70.8 kg)   Well appearing, pleasant female in no distress HEENT: conjunctiva and sclera clear, EOMI, wearing mask Neck: no lymphadenopathy or mass, no bruit. Slight asymmetry with fullness (soft) in R supraclavicular/R neck area, unchanged. Heart: regular rate and rhythm Lungs: clear bilaterally Back: no CVA or spinal tenderness Abdomen: soft, nontender, no mass Extremities: no edema, normal pulses Skin: normal turgor, no rash/lesions Psych: normal mood, affect, hygiene and grooming Neuro: alert and oriented, normal gait.     Chemistry      Component Value Date/Time   NA 140 05/27/2019 0833   K 4.4 05/27/2019 0833   CL 102 05/27/2019 0833   CO2 21 05/27/2019 0833   BUN 18 05/27/2019 0833   CREATININE 1.15 (H) 05/27/2019 0833   CREATININE 0.95 (H) 11/12/2016 0920      Component Value Date/Time   CALCIUM 9.8 05/27/2019 0833   ALKPHOS 41 05/27/2019 0833   AST 19 05/27/2019 0833   ALT 21 05/27/2019 0833   BILITOT 0.5 05/27/2019 0833     Fasting glucose 95  Lab Results  Component Value Date   TSH 2.160 05/27/2019   Lab Results  Component Value Date   CHOL 133 05/27/2019   HDL 37 (L) 05/27/2019   LDLCALC 69 05/27/2019   TRIG 154 (H) 05/27/2019   CHOLHDL 3.6 05/27/2019     ASSESSMENT/PLAN:  Essential hypertension, benign - suboptimally controlled; increase losartan from 34m to 753mdaily. nonfasting b-met in 1 month, and drop off BP log - Plan: losartan (COZAAR) 50 MG tablet,  Comprehensive metabolic panel, Basic metabolic panel  Hypothyroidism, unspecified type - adequately replaced, cont current dose of Synthroid  Osteopenia, unspecified location - counseled re: Ca, D and weight-bearing exercise  Aortic atherosclerosis (HCC)  Atherosclerosis of both carotid arteries - cont ASA, statin, improve BP control  Chronic gout without tophus, unspecified cause, unspecified site - continue allopurinol  Mixed hyperlipidemia - improved, borderline. Continue current meds. reviewed lowfat diet - Plan: fenofibrate 54 MG tablet, rosuvastatin (CRESTOR) 20 MG tablet, Lipid panel  Meniere disease, left - stable on current regimen without any vertigo; stable hearing loss.  - Plan: furosemide (LASIX) 20 MG tablet, potassium chloride SA (KLOR-CON) 20 MEQ tablet  Medication monitoring encounter - Plan: CBC with Differential/Platelet, Lipid panel, TSH, Uric acid, Comprehensive metabolic panel, Basic metabolic panel  Increase Losartan to 7539m1.5 tablets). Return for nonfasting b-met in 1 month, and bring BP log to drop off for review.  F/u as scheduled in September, with fasting  labs prior. CBC, c-met, lipid, uric acid, TSH

## 2019-05-31 ENCOUNTER — Encounter: Payer: Self-pay | Admitting: Family Medicine

## 2019-05-31 ENCOUNTER — Ambulatory Visit: Payer: Medicare PPO | Admitting: Family Medicine

## 2019-05-31 ENCOUNTER — Other Ambulatory Visit: Payer: Self-pay

## 2019-05-31 VITALS — BP 140/80 | HR 80 | Temp 96.3°F | Ht 61.0 in | Wt 157.4 lb

## 2019-05-31 DIAGNOSIS — M8580 Other specified disorders of bone density and structure, unspecified site: Secondary | ICD-10-CM

## 2019-05-31 DIAGNOSIS — M1A9XX0 Chronic gout, unspecified, without tophus (tophi): Secondary | ICD-10-CM | POA: Diagnosis not present

## 2019-05-31 DIAGNOSIS — H8102 Meniere's disease, left ear: Secondary | ICD-10-CM | POA: Diagnosis not present

## 2019-05-31 DIAGNOSIS — I700 Atherosclerosis of aorta: Secondary | ICD-10-CM

## 2019-05-31 DIAGNOSIS — Z5181 Encounter for therapeutic drug level monitoring: Secondary | ICD-10-CM | POA: Diagnosis not present

## 2019-05-31 DIAGNOSIS — E039 Hypothyroidism, unspecified: Secondary | ICD-10-CM | POA: Diagnosis not present

## 2019-05-31 DIAGNOSIS — E782 Mixed hyperlipidemia: Secondary | ICD-10-CM

## 2019-05-31 DIAGNOSIS — I6523 Occlusion and stenosis of bilateral carotid arteries: Secondary | ICD-10-CM | POA: Diagnosis not present

## 2019-05-31 DIAGNOSIS — I10 Essential (primary) hypertension: Secondary | ICD-10-CM | POA: Diagnosis not present

## 2019-05-31 MED ORDER — ALLOPURINOL 100 MG PO TABS: ORAL_TABLET | ORAL | 1 refills | Status: DC

## 2019-05-31 MED ORDER — FUROSEMIDE 20 MG PO TABS: 20.0000 mg | ORAL_TABLET | Freq: Every day | ORAL | 1 refills | Status: DC

## 2019-05-31 MED ORDER — ROSUVASTATIN CALCIUM 20 MG PO TABS: ORAL_TABLET | ORAL | 1 refills | Status: DC

## 2019-05-31 MED ORDER — POTASSIUM CHLORIDE CRYS ER 20 MEQ PO TBCR: EXTENDED_RELEASE_TABLET | ORAL | 1 refills | Status: DC

## 2019-05-31 MED ORDER — FENOFIBRATE 54 MG PO TABS: ORAL_TABLET | ORAL | 1 refills | Status: DC

## 2019-05-31 MED ORDER — LOSARTAN POTASSIUM 50 MG PO TABS: 75.0000 mg | ORAL_TABLET | Freq: Every day | ORAL | 1 refills | Status: DC

## 2019-05-31 NOTE — Patient Instructions (Signed)
Increase losartan to 75 mg, which is 1.5 tablets. Continue to limit your sodium, try and get regular exercise and lose some weight, and monitor your blood pressure. Record your values and bring in your list of blood pressures when you come for your lab visit.

## 2019-06-28 ENCOUNTER — Other Ambulatory Visit: Payer: Self-pay

## 2019-06-28 ENCOUNTER — Other Ambulatory Visit: Payer: Medicare PPO

## 2019-06-28 ENCOUNTER — Telehealth: Payer: Self-pay | Admitting: *Deleted

## 2019-06-28 DIAGNOSIS — Z5181 Encounter for therapeutic drug level monitoring: Secondary | ICD-10-CM | POA: Diagnosis not present

## 2019-06-28 DIAGNOSIS — I10 Essential (primary) hypertension: Secondary | ICD-10-CM

## 2019-06-28 LAB — BASIC METABOLIC PANEL
BUN/Creatinine Ratio: 24 (ref 12–28)
BUN: 24 mg/dL (ref 8–27)
CO2: 22 mmol/L (ref 20–29)
Calcium: 9.7 mg/dL (ref 8.7–10.3)
Chloride: 104 mmol/L (ref 96–106)
Creatinine, Ser: 0.99 mg/dL (ref 0.57–1.00)
GFR calc Af Amer: 64 mL/min/{1.73_m2} (ref >59)
GFR calc non Af Amer: 55 mL/min/{1.73_m2} — ABNORMAL LOW (ref >59)
Glucose: 102 mg/dL — ABNORMAL HIGH (ref 65–99)
Potassium: 4.4 mmol/L (ref 3.5–5.2)
Sodium: 141 mmol/L (ref 134–144)

## 2019-06-28 NOTE — Telephone Encounter (Signed)
Patient was here for labs and dropped off bp log, I put in your folder.

## 2019-06-30 MED ORDER — LOSARTAN POTASSIUM 100 MG PO TABS: 100.0000 mg | ORAL_TABLET | Freq: Every day | ORAL | 0 refills | Status: DC

## 2019-06-30 NOTE — Telephone Encounter (Signed)
See lab results--BP still not at goal, will increase to 100mg  losartan, new rx sent

## 2019-09-05 NOTE — Progress Notes (Addendum)
Chief Complaint  Patient presents with  . other    f/u on BP,    Patient presents to follow up on hypertension. At her 05/31/2019 visit, BP was above goal, so losartan was increased from 50 to 74m.  At her lab visit the next month, BP's were still above goal, and losartan dose was further increased to 1097m Her BP log (brought in at time of lab visit) showed BP's in the 140's/80's consistently.  Since her last visit, her husband passed away.  He had been transfusion-dependent, but died fairly unexpectedly on the day he was to get another transfusion.  She has had a lot of support from friends, family, neighbors, and reports doing well.  BP's have been running 121-144/78-88 (the lower ones were earlier in the day, higher in the afternoon/evening).  Pulse 63-84.  b-met checked after last dose adjustment, and was normal   Chemistry      Component Value Date/Time   NA 141 06/28/2019 0900   K 4.4 06/28/2019 0900   CL 104 06/28/2019 0900   CO2 22 06/28/2019 0900   BUN 24 06/28/2019 0900   CREATININE 0.99 06/28/2019 0900   CREATININE 0.95 (H) 11/12/2016 0920      Component Value Date/Time   CALCIUM 9.7 06/28/2019 0900   ALKPHOS 41 05/27/2019 0833   AST 19 05/27/2019 0833   ALT 21 05/27/2019 0833   BILITOT 0.5 05/27/2019 0833      She is complaining of some insomnia. She slept great for the first couple of weeks after her husband passed away.  Now she has some sporadic problems with insomnia.  Asking what to try (not rx, wants to try melatonin or something else). Not worrying, not anxious, but hard to shut the mind down. Often gets up to make sure she has checked that the door is locked. Reading helps sometimes.  She is also complaining of some bloating and constipation. It was suggested that she take probiotics, and she wants to know if this is a good idea. She has had some change in diet--people bringing her food, going out more.   PMH, PSH, SH reviewed  Outpatient Encounter  Medications as of 09/06/2019  Medication Sig Note  . allopurinol (ZYLOPRIM) 100 MG tablet TAKE 1 TABLET(100 MG) BY MOUTH DAILY   . aspirin EC 81 MG tablet Take 81 mg by mouth daily.   . cholecalciferol (VITAMIN D3) 25 MCG (1000 UNIT) tablet Take 1,000 Units by mouth daily.   . fenofibrate 54 MG tablet TAKE 1 TABLET(54 MG) BY MOUTH DAILY   . furosemide (LASIX) 20 MG tablet Take 1 tablet (20 mg total) by mouth daily.   . Marland Kitchenosartan (COZAAR) 100 MG tablet Take 1 tablet (100 mg total) by mouth daily. TAKE 1 TABLET(50 MG) BY MOUTH DAILY   . Omega-3 Fatty Acids (FISH OIL) 1200 MG CAPS Take 4 capsules by mouth daily.    . potassium chloride SA (KLOR-CON) 20 MEQ tablet TAKE 1 TABLET(20 MEQ) BY MOUTH DAILY   . rosuvastatin (CRESTOR) 20 MG tablet TAKE 1 TABLET(20 MG) BY MOUTH DAILY   . SYNTHROID 75 MCG tablet TAKE 1 TABLET (75 MCG TOTAL) DAILY BEFORE BREAKFAST.   . Travoprost, BAK Free, (TRAVATAN) 0.004 % SOLN ophthalmic solution Place 1 drop into both eyes at bedtime.   . Coenzyme Q10 (COQ10) 100 MG CAPS Take 100 mg by mouth daily.   . hydrocortisone 2.5 % cream Apply to affected areas of skin twice daily as needed for rash/itching (Patient  not taking: Reported on 09/06/2019) 11/14/2016: Uses to vaginal area 2x/week prn  . omeprazole (PRILOSEC) 20 MG capsule Take 20 mg by mouth daily. 10/10/2014: Only takes prn   Facility-Administered Encounter Medications as of 09/06/2019  Medication  . influenza  inactive virus vaccine (FLUZONE/FLUARIX) injection 0.5 mL   Allergies  Allergen Reactions  . Lisinopril Cough  . Penicillins Rash    ROS:  No fever, chills, URI symptoms, headaches, dizziness, chest pain, shortness of breath, edema.  No rashes, bleeding/bruising. No vertigo, muscle cramps. Moods are okay, denies depression, doing well. Lost 10# prior to husband's death. Gained some back--eating out more, people bringing her foot.  She has had some bloating and constipation.    PHYSICAL EXAM:  BP 134/80 (BP  Location: Left Arm, Patient Position: Sitting, Cuff Size: Normal)   Pulse 80   Temp 97.8 F (36.6 C)   Wt 155 lb 9.6 oz (70.6 kg)   BMI 29.40 kg/m   Wt Readings from Last 3 Encounters:  09/06/19 155 lb 9.6 oz (70.6 kg)  05/31/19 157 lb 6.4 oz (71.4 kg)  11/25/18 156 lb 6.4 oz (70.9 kg)   Well-appearing, pleasant female, in good spirits HEENT: conjunctiva and sclera are clear, EOMI, wearing mask Neck: no lymphadenopathy, thyromegaly or bruit Heart: regular rate and rhythm Lungs: clear bilaterally Back: no spinal or CVA tenderness Abdomen: soft, nontender Extremities: no edema. Neuro: alert and oriented, normal gait Psych: normal mood, affect, hygiene and grooming   ASSESSMENT/PLAN:  Essential hypertension, benign - improved control; cont losartan 110m.  recheck b-met (pt on lasix and K+ for Meniere's). Low Na diet, exercise, wt loss - Plan: Basic metabolic panel  Medication monitoring encounter - Plan: Basic metabolic panel    F/u as scheduled in September, with fasting labs prior

## 2019-09-06 ENCOUNTER — Encounter: Payer: Self-pay | Admitting: Family Medicine

## 2019-09-06 ENCOUNTER — Other Ambulatory Visit: Payer: Self-pay

## 2019-09-06 ENCOUNTER — Ambulatory Visit: Payer: Medicare PPO | Admitting: Family Medicine

## 2019-09-06 VITALS — BP 134/80 | HR 80 | Temp 97.8°F | Wt 155.6 lb

## 2019-09-06 DIAGNOSIS — I10 Essential (primary) hypertension: Secondary | ICD-10-CM

## 2019-09-06 DIAGNOSIS — Z5181 Encounter for therapeutic drug level monitoring: Secondary | ICD-10-CM

## 2019-09-06 NOTE — Patient Instructions (Addendum)
Consider grief counseling through hospice Water quality scientist) if needed in the future.  You can try melatonin or benadryl at bedtime, if needed to help you sleep.  Insomnia Insomnia is a sleep disorder that makes it difficult to fall asleep or stay asleep. Insomnia can cause fatigue, low energy, difficulty concentrating, mood swings, and poor performance at work or school. There are three different ways to classify insomnia:  Difficulty falling asleep.  Difficulty staying asleep.  Waking up too early in the morning. Any type of insomnia can be long-term (chronic) or short-term (acute). Both are common. Short-term insomnia usually lasts for three months or less. Chronic insomnia occurs at least three times a week for longer than three months. What are the causes? Insomnia may be caused by another condition, situation, or substance, such as:  Anxiety.  Certain medicines.  Gastroesophageal reflux disease (GERD) or other gastrointestinal conditions.  Asthma or other breathing conditions.  Restless legs syndrome, sleep apnea, or other sleep disorders.  Chronic pain.  Menopause.  Stroke.  Abuse of alcohol, tobacco, or illegal drugs.  Mental health conditions, such as depression.  Caffeine.  Neurological disorders, such as Alzheimer's disease.  An overactive thyroid (hyperthyroidism). Sometimes, the cause of insomnia may not be known. What increases the risk? Risk factors for insomnia include:  Gender. Women are affected more often than men.  Age. Insomnia is more common as you get older.  Stress.  Lack of exercise.  Irregular work schedule or working night shifts.  Traveling between different time zones.  Certain medical and mental health conditions. What are the signs or symptoms? If you have insomnia, the main symptom is having trouble falling asleep or having trouble staying asleep. This may lead to other symptoms, such as:  Feeling fatigued or having  low energy.  Feeling nervous about going to sleep.  Not feeling rested in the morning.  Having trouble concentrating.  Feeling irritable, anxious, or depressed. How is this diagnosed? This condition may be diagnosed based on:  Your symptoms and medical history. Your health care provider may ask about: ? Your sleep habits. ? Any medical conditions you have. ? Your mental health.  A physical exam. How is this treated? Treatment for insomnia depends on the cause. Treatment may focus on treating an underlying condition that is causing insomnia. Treatment may also include:  Medicines to help you sleep.  Counseling or therapy.  Lifestyle adjustments to help you sleep better. Follow these instructions at home: Eating and drinking   Limit or avoid alcohol, caffeinated beverages, and cigarettes, especially close to bedtime. These can disrupt your sleep.  Do not eat a large meal or eat spicy foods right before bedtime. This can lead to digestive discomfort that can make it hard for you to sleep. Sleep habits   Keep a sleep diary to help you and your health care provider figure out what could be causing your insomnia. Write down: ? When you sleep. ? When you wake up during the night. ? How well you sleep. ? How rested you feel the next day. ? Any side effects of medicines you are taking. ? What you eat and drink.  Make your bedroom a dark, comfortable place where it is easy to fall asleep. ? Put up shades or blackout curtains to block light from outside. ? Use a white noise machine to block noise. ? Keep the temperature cool.  Limit screen use before bedtime. This includes: ? Watching TV. ? Using your smartphone, tablet, or computer.  Stick to a routine that includes going to bed and waking up at the same times every day and night. This can help you fall asleep faster. Consider making a quiet activity, such as reading, part of your nighttime routine.  Try to avoid taking  naps during the day so that you sleep better at night.  Get out of bed if you are still awake after 15 minutes of trying to sleep. Keep the lights down, but try reading or doing a quiet activity. When you feel sleepy, go back to bed. General instructions  Take over-the-counter and prescription medicines only as told by your health care provider.  Exercise regularly, as told by your health care provider. Avoid exercise starting several hours before bedtime.  Use relaxation techniques to manage stress. Ask your health care provider to suggest some techniques that may work well for you. These may include: ? Breathing exercises. ? Routines to release muscle tension. ? Visualizing peaceful scenes.  Make sure that you drive carefully. Avoid driving if you feel very sleepy.  Keep all follow-up visits as told by your health care provider. This is important. Contact a health care provider if:  You are tired throughout the day.  You have trouble in your daily routine due to sleepiness.  You continue to have sleep problems, or your sleep problems get worse. Get help right away if:  You have serious thoughts about hurting yourself or someone else. If you ever feel like you may hurt yourself or others, or have thoughts about taking your own life, get help right away. You can go to your nearest emergency department or call:  Your local emergency services (911 in the [place]  A suicide crisis helpline, such as the National Suicide Prevention Lifeline at (925) 532-1709. This is open 24 hours a day. Summary  Insomnia is a sleep disorder that makes it difficult to fall asleep or stay asleep.  Insomnia can be long-term (chronic) or short-term (acute).  Treatment for insomnia depends on the cause. Treatment may focus on treating an underlying condition that is causing insomnia.  Keep a sleep diary to help you and your health care provider figure out what could be causing your insomnia. This  information is not intended to replace advice given to you by your health care provider. Make sure you discuss any questions you have with your health care provider. Document Revised: 02/28/2017 Document Reviewed: 12/26/2016 Elsevier Patient Education  2020 [employer].

## 2019-09-07 LAB — BASIC METABOLIC PANEL
BUN/Creatinine Ratio: 24 (ref 12–28)
BUN: 20 mg/dL (ref 8–27)
CO2: 19 mmol/L — ABNORMAL LOW (ref 20–29)
Calcium: 9.7 mg/dL (ref 8.7–10.3)
Chloride: 105 mmol/L (ref 96–106)
Creatinine, Ser: 0.84 mg/dL (ref 0.57–1.00)
GFR calc Af Amer: 78 mL/min/{1.73_m2} (ref >59)
GFR calc non Af Amer: 67 mL/min/{1.73_m2} (ref >59)
Glucose: 88 mg/dL (ref 65–99)
Potassium: 4.8 mmol/L (ref 3.5–5.2)
Sodium: 139 mmol/L (ref 134–144)

## 2019-09-07 MED ORDER — LOSARTAN POTASSIUM 100 MG PO TABS: 100.0000 mg | ORAL_TABLET | Freq: Every day | ORAL | 1 refills | Status: DC

## 2019-09-07 NOTE — Addendum Note (Signed)
Addended by: Joselyn Arrow on: 09/07/2019 08:20 AM   Modules accepted: Orders

## 2019-09-15 ENCOUNTER — Telehealth: Payer: Self-pay | Admitting: Family Medicine

## 2019-09-15 NOTE — Telephone Encounter (Signed)
Pt requesting refill on synthroid tab 75 mcg   [facility] - [place], [place] - [address]  [address] [place] [address]  Phone:  [phone] Fax:  [phone]

## 2019-10-01 ENCOUNTER — Telehealth: Payer: Self-pay | Admitting: Family Medicine

## 2019-10-01 NOTE — Telephone Encounter (Signed)
Pharmacy sent refill request for synthroid please send to the [facility] [place], [place] - [address]

## 2019-10-05 ENCOUNTER — Encounter: Payer: Self-pay | Admitting: Family Medicine

## 2019-10-05 MED ORDER — SYNTHROID 75 MCG PO TABS: ORAL_TABLET | ORAL | 0 refills | Status: DC

## 2019-10-06 NOTE — Telephone Encounter (Signed)
Done 10/05/19

## 2019-11-29 ENCOUNTER — Other Ambulatory Visit: Payer: Self-pay

## 2019-11-29 ENCOUNTER — Other Ambulatory Visit: Payer: Medicare PPO

## 2019-11-29 DIAGNOSIS — Z5181 Encounter for therapeutic drug level monitoring: Secondary | ICD-10-CM | POA: Diagnosis not present

## 2019-11-29 DIAGNOSIS — I10 Essential (primary) hypertension: Secondary | ICD-10-CM | POA: Diagnosis not present

## 2019-11-29 DIAGNOSIS — E782 Mixed hyperlipidemia: Secondary | ICD-10-CM

## 2019-11-30 ENCOUNTER — Other Ambulatory Visit: Payer: Self-pay | Admitting: Family Medicine

## 2019-11-30 DIAGNOSIS — H8102 Meniere's disease, left ear: Secondary | ICD-10-CM

## 2019-11-30 LAB — COMPREHENSIVE METABOLIC PANEL
ALT: 17 IU/L (ref 0–32)
AST: 21 IU/L (ref 0–40)
Albumin/Globulin Ratio: 2 (ref 1.2–2.2)
Albumin: 4.5 g/dL (ref 3.7–4.7)
Alkaline Phosphatase: 37 IU/L — ABNORMAL LOW (ref 48–121)
BUN/Creatinine Ratio: 29 — ABNORMAL HIGH (ref 12–28)
BUN: 28 mg/dL — ABNORMAL HIGH (ref 8–27)
Bilirubin Total: 0.4 mg/dL (ref 0.0–1.2)
CO2: 23 mmol/L (ref 20–29)
Calcium: 9.5 mg/dL (ref 8.7–10.3)
Chloride: 102 mmol/L (ref 96–106)
Creatinine, Ser: 0.95 mg/dL (ref 0.57–1.00)
GFR calc Af Amer: 66 mL/min/{1.73_m2} (ref >59)
GFR calc non Af Amer: 58 mL/min/{1.73_m2} — ABNORMAL LOW (ref >59)
Globulin, Total: 2.2 g/dL (ref 1.5–4.5)
Glucose: 83 mg/dL (ref 65–99)
Potassium: 4.3 mmol/L (ref 3.5–5.2)
Sodium: 139 mmol/L (ref 134–144)
Total Protein: 6.7 g/dL (ref 6.0–8.5)

## 2019-11-30 LAB — CBC WITH DIFFERENTIAL/PLATELET
Basophils Absolute: 0.1 10*3/uL (ref 0.0–0.2)
Basos: 1 %
EOS (ABSOLUTE): 0.3 10*3/uL (ref 0.0–0.4)
Eos: 6 %
Hematocrit: 40.1 % (ref 34.0–46.6)
Hemoglobin: 13.2 g/dL (ref 11.1–15.9)
Immature Grans (Abs): 0 10*3/uL (ref 0.0–0.1)
Immature Granulocytes: 0 %
Lymphocytes Absolute: 1.9 10*3/uL (ref 0.7–3.1)
Lymphs: 35 %
MCH: 30.3 pg (ref 26.6–33.0)
MCHC: 32.9 g/dL (ref 31.5–35.7)
MCV: 92 fL (ref 79–97)
Monocytes Absolute: 0.6 10*3/uL (ref 0.1–0.9)
Monocytes: 11 %
Neutrophils Absolute: 2.6 10*3/uL (ref 1.4–7.0)
Neutrophils: 47 %
Platelets: 191 10*3/uL (ref 150–450)
RBC: 4.36 x10E6/uL (ref 3.77–5.28)
RDW: 13.1 % (ref 11.7–15.4)
WBC: 5.5 10*3/uL (ref 3.4–10.8)

## 2019-11-30 LAB — URIC ACID: Uric Acid: 5.6 mg/dL (ref 3.1–7.9)

## 2019-11-30 LAB — LIPID PANEL
Chol/HDL Ratio: 3.8 ratio (ref 0.0–4.4)
Cholesterol, Total: 144 mg/dL (ref 100–199)
HDL: 38 mg/dL — ABNORMAL LOW (ref >39)
LDL Chol Calc (NIH): 78 mg/dL (ref 0–99)
Triglycerides: 162 mg/dL — ABNORMAL HIGH (ref 0–149)
VLDL Cholesterol Cal: 28 mg/dL (ref 5–40)

## 2019-11-30 LAB — TSH: TSH: 2.45 u[IU]/mL (ref 0.450–4.500)

## 2019-11-30 NOTE — Telephone Encounter (Signed)
She has appt 9/1.  Check with pt to see if she can wait until her visit (when add'l refills would be put with it, rather than just 90d)

## 2019-11-30 NOTE — Telephone Encounter (Signed)
Is this okay to refill? Pt had BMP in june

## 2019-11-30 NOTE — Telephone Encounter (Signed)
She will need refills but is ok to wait until appointment as she will need refills on other meds as well

## 2019-11-30 NOTE — Patient Instructions (Addendum)
HEALTH MAINTENANCE RECOMMENDATIONS:  It is recommended that you get at least 30 minutes of aerobic exercise at least 5 days/week (for weight loss, you may need as much as 60-90 minutes). This can be any activity that gets your heart rate up. This can be divided in 10-15 minute intervals if needed, but try and build up your endurance at least once a week.  Weight bearing exercise is also recommended twice weekly.  Eat a healthy diet with lots of vegetables, fruits and fiber.  "Colorful" foods have a lot of vitamins (ie green vegetables, tomatoes, red peppers, etc).  Limit sweet tea, regular sodas and alcoholic beverages, all of which has a lot of calories and sugar.  Up to 1 alcoholic drink daily may be beneficial for women (unless trying to lose weight, watch sugars).  Drink a lot of water.  Calcium recommendations are 1200-1500 mg daily (1500 mg for postmenopausal women or women without ovaries), and vitamin D 1000 IU daily.  This should be obtained from diet and/or supplements (vitamins), and calcium should not be taken all at once, but in divided doses.  Monthly self breast exams and yearly mammograms for women over the age of 91 is recommended.  Sunscreen of at least SPF 30 should be used on all sun-exposed parts of the skin when outside between the hours of 10 am and 4 pm (not just when at beach or pool, but even with exercise, golf, tennis, and yard work!)  Use a sunscreen that says "broad spectrum" so it covers both UVA and UVB rays, and make sure to reapply every 1-2 hours.  Remember to change the batteries in your smoke detectors when changing your clock times in the spring and fall. Carbon monoxide detectors are recommended for your home.  Use your seat belt every time you are in a car, and please drive safely and not be distracted with cell phones and texting while driving.   Ms. Amy , Thank you for taking time to come for your Medicare Wellness Visit. I appreciate your ongoing  commitment to your health goals. Please review the following plan we discussed and let me know if I can assist you in the future.    This is a list of the screening recommended for you and due dates:  Health Maintenance  Topic Date Due  .  Hepatitis C: One time screening is recommended by Center for Disease Control  (CDC) for  adults born from [birth year] through 1964.   Never done  . Flu Shot  10/31/2019  . Tetanus Vaccine  03/23/2022  . DEXA scan (bone density measurement)  Completed  . COVID-19 Vaccine  Completed  . Pneumonia vaccines  Completed   Hepatitis C screening is now recommended for all adults (not just the "baby boomers"). We can do this with your next labs (new recommendation).  Continue yearly mammograms. Be sure to get the mammogram before you get your COVID vaccine booster. Okay to wait until September/October for the high dose flu shot. You will be due for COVID booster the end of October--follow the news for any changes in these recommendations.    Bone density test should be repeated in 04/2020 (2 years since stopping medication).  You can call and schedule this at your convenience (I'm entering the order today).  I recommend getting the new shingles vaccine (Shingrix). You will need to get this from the pharmacy, as it is covered by Medicare Part D.  It is a series of 2 injections, spaced  2 months apart.

## 2019-11-30 NOTE — Progress Notes (Signed)
Chief Complaint  Patient presents with  . Medicare Wellness    nonfasting AWV with pelvic. No new concerns. Will take flu shot next month here.   Kaitlyn Abbott is a 79 y.o. female who presents for annual physical exam, Medicare wellness visit and follow-up on chronic medical conditions. She had labs drawn prior to her visit, see below.  Hypertension:  She is currently taking losartan 14m daily.  BP's are running 118-140/65-87 (average approx 125-130/75; it was 114/77 this morning). She reports compliance with medications (losartan, also on lasix for Meniere's), follow low sodium diet. Denies headaches, dizziness, chest pain, edema, shortness of breath.  Mixed hyperlipidemia: She has been trying to follow a lowfat diet. She only rarely eats fried foods.  She reports compliance with her medications (Crestor, fenofibrate and fish oil, 4x/d), and has no side effects.   Meniere's disease--She istakingfurosemide (and potassium) without side effects or muscle cramps. Hasn't had any flares of vertigo. She has decreased hearing in the left ear and uses a hearing aid. She is under the care ofDr. TBenjamine Mola sees him yearly.  Gout--no flare in a few years. No side effects to allopurinol.  Hypothyroidism: She is compliant with taking her Synthroid on empty stomach, separate from other medications. She never really had any symptoms related to her thyroid. She denies any changes in hair/skin/bowels/moods. She denies any missed doses.  Prediabetes--A1c had been up to 5.9 in 04/2015.IMamie Nickbeen normal since then.She has tried to cut back on carbs, and walk more.Doesn't drink regular soda, sweet tea.  Osteopenia: She took alendronate from 2013 until her last DEXA in 04/2018 (T-1.7 R fem neck).  Shedrinks milk, occasional cheese.She has Activiayogurtonce or twice daily. She is taking vitamin D 1000 IU daily. Getting weight-bearing exercise (using weights while watching TV at night,  3-4x/week).  Atherosclerotic plaque was noted bilaterally on carotid UKoreadone 12/2018.  There was no significant stenosis.  She takes a statin and ASA 854mdaily.  She denies any neuro symptoms. Denies any bleeding or GI complaints.   Immunization History  Administered Date(s) Administered  . Fluad Quad(high Dose 65+) 12/24/2018  . Influenza Split 11/29/2010, 01/14/2012  . Influenza, High Dose Seasonal PF 01/26/2013, 12/30/2013, 01/12/2015, 01/17/2016, 01/21/2017, 12/25/2017  . PFIZER SARS-COV-2 Vaccination 05/06/2019, 05/27/2019  . Pneumococcal Conjugate-13 10/06/2013  . Pneumococcal Polysaccharide-23 01/24/2010  . Tdap 03/23/2012  . Zoster 04/21/2015   Last Pap smear: 09/2015, normal, with no high risk HPV Last mammogram:12/2018 Last colonoscopy: 12/2017 Dr. MaWatt Climesint/ext hemorrhoids, diverticulosis (she had +Cologuard 10/2017) Last DEXA: 04/2018 T-1.7 R fem neck (unchanged from 2017) Dentist: regularly, twice yearly Ophtho:yearly Exercise: walks with a friend 3-4 days/week (every other day), for 45-60 mins (2.5-3 miles). Uses her handweights 4 days/week.  (used to go to community center 2x/week). AAA screen negative 09/2015  Other doctors caring for patient include: Ophtho: Dr. [facility]Laban [facility] BeTommy RainwaterDentist: Dr.Saraf Periodontist: Dr. TaSatira Sarkrtho: Dr. WaNoemi ChapelI: Dr. MaWatt Climeserm: Dr. StDerrel NipGSCeibaPodiatrist: Dr. RePaulla DollyNT: Dr. TeBenjamine Mola Depression screen:Negative Functional Status survey:Notable for hearing loss in L ear, unchanged(related to meniere's).  Fall screen is negative. Mini-Cog screen:normal (5)  See full screens in Epic  End of Life Discussion: Patient has a living will and medical power of attorney; plans to make changes and get [place]. Hasn't made the changes yet, but plans to meet with attorney. New forms given.  PMH, PSH, SH and FH reviewed and updated  Outpatient Encounter Medications  as of 12/01/2019   Medication Sig Note  . allopurinol (ZYLOPRIM) 100 MG tablet TAKE 1 TABLET(100 MG) BY MOUTH DAILY   . aspirin EC 81 MG tablet Take 81 mg by mouth daily.   . cholecalciferol (VITAMIN D3) 25 MCG (1000 UNIT) tablet Take 1,000 Units by mouth daily.   . fenofibrate 54 MG tablet TAKE 1 TABLET(54 MG) BY MOUTH DAILY   . furosemide (LASIX) 20 MG tablet Take 1 tablet (20 mg total) by mouth daily.   . hydrocortisone 2.5 % cream Apply to affected areas of skin twice daily as needed for rash/itching 11/14/2016: Uses to vaginal area 2x/week prn  . losartan (COZAAR) 100 MG tablet Take 1 tablet (100 mg total) by mouth daily. TAKE 1 TABLET(50 MG) BY MOUTH DAILY   . Omega-3 Fatty Acids (FISH OIL) 1200 MG CAPS Take 4 capsules by mouth daily.    . potassium chloride SA (KLOR-CON) 20 MEQ tablet TAKE 1 TABLET(20 MEQ) BY MOUTH DAILY   . rosuvastatin (CRESTOR) 20 MG tablet TAKE 1 TABLET(20 MG) BY MOUTH DAILY   . SYNTHROID 75 MCG tablet TAKE 1 TABLET (75 MCG TOTAL) DAILY BEFORE BREAKFAST.   . Travoprost, BAK Free, (TRAVATAN) 0.004 % SOLN ophthalmic solution Place 1 drop into both eyes at bedtime.   . [DISCONTINUED] fenofibrate 54 MG tablet TAKE 1 TABLET(54 MG) BY MOUTH DAILY   . [DISCONTINUED] furosemide (LASIX) 20 MG tablet Take 1 tablet (20 mg total) by mouth daily.   . [DISCONTINUED] potassium chloride SA (KLOR-CON) 20 MEQ tablet TAKE 1 TABLET(20 MEQ) BY MOUTH DAILY   . [DISCONTINUED] SYNTHROID 75 MCG tablet TAKE 1 TABLET (75 MCG TOTAL) DAILY BEFORE BREAKFAST.   Marland Kitchen omeprazole (PRILOSEC) 20 MG capsule Take 20 mg by mouth daily. (Patient not taking: Reported on 12/01/2019) 10/10/2014: Only takes prn   Facility-Administered Encounter Medications as of 12/01/2019  Medication  . influenza  inactive virus vaccine (FLUZONE/FLUARIX) injection 0.5 mL   Allergies  Allergen Reactions  . Lisinopril Cough  . Penicillins Rash   ROS: The patient denies anorexia, fever, headaches; vision changes, decreased hearing (diminished on  left, chronic/unchanged), ear pain, sore throat, breast concerns, chest pain, palpitations, dizziness, syncope, dyspnea on exertion, cough, swelling, nausea, vomiting, diarrhea, constipation, abdominal pain, melena, hematochezia, indigestion/heartburn, hematuria, incontinence, dysuria, vaginal bleeding, discharge, or odor;slight external vaginal itchis controlled with hydrocortisone creamsporadically (relieved by topical cream);denies genital lesions, numbness, tingling, weakness, tremor, depression, anxiety, abnormal bleeding/bruising, or enlarged lymph nodes.  +arthritis in her hands, stiff in the morning.  No gout flares. Flare of R hip pain (lateral) after cleaning floors, improving. She is no longer aware of the swelling on the right neck   PHYSICAL EXAM:  BP (!) 150/90   Pulse 72   Ht 5' 1" (1.549 m)   Wt 154 lb 6.4 oz (70 kg)   BMI 29.17 kg/m   138/74 on repeat by MD  Wt Readings from Last 3 Encounters:  12/01/19 154 lb 6.4 oz (70 kg)  09/06/19 155 lb 9.6 oz (70.6 kg)  05/31/19 157 lb 6.4 oz (71.4 kg)    General Appearance:  Alert, cooperative, no distress, appears stated age   Head:  Normocephalic, without obvious abnormality, atraumatic   Eyes:  PERRL, conjunctiva/corneas clear, EOM's intact, fundi benign   Ears:  Normal TMs and external ears bilaterally  Nose:  Not examined (wearing mask due to COVID-19 pandemic)  Throat:  Not examined (wearing mask due to COVID-19 pandemic)  Neck:  Supple, no lymphadenopathy; thyroid:  no enlargement/ tenderness/nodules; no carotid bruit or JVD.  No significant asymmetry is noted at the neck/supraclavicular area (last year was more prominent on the right).   Back:  Spine nontender, no curvature, ROM normal, no CVA tenderness   Lungs:  Clear to auscultation bilaterally without wheezes, rales or ronchi; respirations unlabored   Chest Wall:  No tenderness or deformity   Heart:  Regular rate and rhythm, S1  and S2 normal, no murmur, rub or gallop   Breast Exam:  No tenderness, masses, or nipple discharge or inversion. No axillary lymphadenopathy   Abdomen:  Soft, non-tender, nondistended, normoactive bowel sounds, no masses, no hepatosplenomegaly   Genitalia:  Normal external genitalia without lesions. Mild atrophic changes, and some slight thickening (mild, diffuse)of external genitalia/labia majora noted.BUS and vagina normal; no cervical motion tenderness. No abnormal vaginal discharge. Uterus and adnexa not enlarged, nontender, no masses. Pap notperformed   Rectal:  Normal tone, no masses or tenderness; guaiac negative stool   Extremities:  No clubbing, cyanosis or edema.   Pulses:  2+ and symmetric all extremities   Skin:  Skin color, texture, turgor normal, no rashes.   Lymph nodes:  Cervical, supraclavicular, and axillary nodes normal   Neurologic:  CNII-XII intact, normal strength, sensation and gait; reflexes 2+ and symmetric throughout    Psych: Normal mood, affect, hygiene and grooming     Chemistry      Component Value Date/Time   NA 139 11/29/2019 0837   K 4.3 11/29/2019 0837   CL 102 11/29/2019 0837   CO2 23 11/29/2019 0837   BUN 28 (H) 11/29/2019 0837   CREATININE 0.95 11/29/2019 0837   CREATININE 0.95 (H) 11/12/2016 0920      Component Value Date/Time   CALCIUM 9.5 11/29/2019 0837   ALKPHOS 37 (L) 11/29/2019 0837   AST 21 11/29/2019 0837   ALT 17 11/29/2019 0837   BILITOT 0.4 11/29/2019 0837     Fasting glucose 83  Lab Results  Component Value Date   WBC 5.5 11/29/2019   HGB 13.2 11/29/2019   HCT 40.1 11/29/2019   MCV 92 11/29/2019   PLT 191 11/29/2019   Lab Results  Component Value Date   TSH 2.450 11/29/2019   Lab Results  Component Value Date   LABURIC 5.6 11/29/2019   Lab Results  Component Value Date   CHOL 144 11/29/2019   HDL 38 (L) 11/29/2019   LDLCALC 78 11/29/2019   TRIG 162  (H) 11/29/2019   CHOLHDL 3.8 11/29/2019     ASSESSMENT/PLAN:   High dose flu shot when available--she prefers to wait until October Shingrix from pharmacy COVID booster in end of October--to get mammo prior  Need copies of living will, healthcare POA--?need new forms given   Discussed monthly self breast exams and yearly mammograms; at least 30 minutes of aerobic activity at least 5 days/week, weight-bearing exercise at least 2x/wk; proper sunscreen use reviewed; healthy diet, including goals of calcium and vitamin D intake and alcohol recommendations (less than or equal to 1 drink/day) reviewed; regular seatbelt use; changing batteries in smoke detectors. Immunization recommendations discussed--continueyearly high dose flu shots.Shingrix recommended, risks/side effects reviewed, to get from pharmacy.Colonoscopy recommendations reviewed--UTD.  Repeat DEXA 04/2020 (2 years after stopping alendronate)  MOST form reviewed and updated. Full code, Full Care New forms given for living will and healthcare POA and discussed.  F/u 6 months, no labs prior.  Will check Hep C, c-met (nonfasting) at visit.    Medicare Attestation I have  personally reviewed: The patient's medical and social history Their use of alcohol, tobacco or illicit drugs Their current medications and supplements The patient's functional ability including ADLs,fall risks, home safety risks, cognitive, and hearing and visual impairment Diet and physical activities Evidence for depression or mood disorders  The patient's weight, height, BMI have been recorded in the chart.  I have made referrals, counseling, and provided education to the patient based on review of the above and I have provided the patient with a written personalized care plan for preventive services.

## 2019-12-01 ENCOUNTER — Encounter: Payer: Self-pay | Admitting: Family Medicine

## 2019-12-01 ENCOUNTER — Ambulatory Visit: Payer: Medicare PPO | Admitting: Family Medicine

## 2019-12-01 ENCOUNTER — Other Ambulatory Visit: Payer: Self-pay

## 2019-12-01 VITALS — BP 138/74 | HR 72 | Ht 61.0 in | Wt 154.4 lb

## 2019-12-01 DIAGNOSIS — E782 Mixed hyperlipidemia: Secondary | ICD-10-CM | POA: Diagnosis not present

## 2019-12-01 DIAGNOSIS — M8580 Other specified disorders of bone density and structure, unspecified site: Secondary | ICD-10-CM

## 2019-12-01 DIAGNOSIS — I10 Essential (primary) hypertension: Secondary | ICD-10-CM | POA: Diagnosis not present

## 2019-12-01 DIAGNOSIS — I700 Atherosclerosis of aorta: Secondary | ICD-10-CM

## 2019-12-01 DIAGNOSIS — H8102 Meniere's disease, left ear: Secondary | ICD-10-CM

## 2019-12-01 DIAGNOSIS — M1A9XX0 Chronic gout, unspecified, without tophus (tophi): Secondary | ICD-10-CM

## 2019-12-01 DIAGNOSIS — E039 Hypothyroidism, unspecified: Secondary | ICD-10-CM | POA: Diagnosis not present

## 2019-12-01 DIAGNOSIS — I6523 Occlusion and stenosis of bilateral carotid arteries: Secondary | ICD-10-CM

## 2019-12-01 DIAGNOSIS — Z0000 Encounter for general adult medical examination without abnormal findings: Secondary | ICD-10-CM

## 2019-12-01 MED ORDER — FENOFIBRATE 54 MG PO TABS: ORAL_TABLET | ORAL | 1 refills | Status: DC

## 2019-12-01 MED ORDER — FUROSEMIDE 20 MG PO TABS: 20.0000 mg | ORAL_TABLET | Freq: Every day | ORAL | 1 refills | Status: DC

## 2019-12-01 MED ORDER — SYNTHROID 75 MCG PO TABS: ORAL_TABLET | ORAL | 3 refills | Status: DC

## 2019-12-01 MED ORDER — POTASSIUM CHLORIDE CRYS ER 20 MEQ PO TBCR: EXTENDED_RELEASE_TABLET | ORAL | 1 refills | Status: DC

## 2019-12-17 ENCOUNTER — Other Ambulatory Visit: Payer: Self-pay | Admitting: Family Medicine

## 2019-12-17 DIAGNOSIS — Z0000 Encounter for general adult medical examination without abnormal findings: Secondary | ICD-10-CM

## 2019-12-19 ENCOUNTER — Other Ambulatory Visit: Payer: Self-pay | Admitting: Family Medicine

## 2019-12-19 DIAGNOSIS — E782 Mixed hyperlipidemia: Secondary | ICD-10-CM

## 2020-01-17 ENCOUNTER — Other Ambulatory Visit (INDEPENDENT_AMBULATORY_CARE_PROVIDER_SITE_OTHER): Payer: Medicare PPO

## 2020-01-17 ENCOUNTER — Other Ambulatory Visit: Payer: Self-pay

## 2020-01-17 DIAGNOSIS — Z23 Encounter for immunization: Secondary | ICD-10-CM

## 2020-01-21 DIAGNOSIS — H401132 Primary open-angle glaucoma, bilateral, moderate stage: Secondary | ICD-10-CM | POA: Diagnosis not present

## 2020-01-21 DIAGNOSIS — H2513 Age-related nuclear cataract, bilateral: Secondary | ICD-10-CM | POA: Diagnosis not present

## 2020-01-31 ENCOUNTER — Other Ambulatory Visit: Payer: Self-pay

## 2020-01-31 ENCOUNTER — Ambulatory Visit
Admission: RE | Admit: 2020-01-31 | Discharge: 2020-01-31 | Disposition: A | Discharge status: Home / Self Care | Payer: Medicare PPO | Source: Ambulatory Visit | Attending: Family Medicine | Admitting: Family Medicine

## 2020-01-31 ENCOUNTER — Ambulatory Visit (INDEPENDENT_AMBULATORY_CARE_PROVIDER_SITE_OTHER): Payer: Medicare Other

## 2020-01-31 DIAGNOSIS — Z23 Encounter for immunization: Secondary | ICD-10-CM

## 2020-01-31 DIAGNOSIS — Z1231 Encounter for screening mammogram for malignant neoplasm of breast: Secondary | ICD-10-CM | POA: Diagnosis not present

## 2020-01-31 DIAGNOSIS — Z0000 Encounter for general adult medical examination without abnormal findings: Secondary | ICD-10-CM

## 2020-03-03 ENCOUNTER — Other Ambulatory Visit: Payer: Self-pay | Admitting: Family Medicine

## 2020-03-03 DIAGNOSIS — I10 Essential (primary) hypertension: Secondary | ICD-10-CM

## 2020-04-18 ENCOUNTER — Ambulatory Visit
Admission: RE | Admit: 2020-04-18 | Discharge: 2020-04-18 | Disposition: A | Discharge status: Home / Self Care | Payer: Medicare PPO | Source: Ambulatory Visit | Attending: Family Medicine | Admitting: Family Medicine

## 2020-04-18 ENCOUNTER — Other Ambulatory Visit: Payer: Self-pay

## 2020-04-18 ENCOUNTER — Other Ambulatory Visit: Payer: Medicare PPO

## 2020-04-18 DIAGNOSIS — M8580 Other specified disorders of bone density and structure, unspecified site: Secondary | ICD-10-CM

## 2020-04-18 DIAGNOSIS — M85851 Other specified disorders of bone density and structure, right thigh: Secondary | ICD-10-CM | POA: Diagnosis not present

## 2020-04-18 DIAGNOSIS — Z780 Asymptomatic menopausal state: Secondary | ICD-10-CM | POA: Diagnosis not present

## 2020-05-25 DIAGNOSIS — M545 Low back pain, unspecified: Secondary | ICD-10-CM | POA: Diagnosis not present

## 2020-05-26 ENCOUNTER — Encounter: Payer: Self-pay | Admitting: Family Medicine

## 2020-05-29 ENCOUNTER — Telehealth: Payer: Self-pay | Admitting: *Deleted

## 2020-05-29 DIAGNOSIS — M5416 Radiculopathy, lumbar region: Secondary | ICD-10-CM | POA: Diagnosis not present

## 2020-05-29 NOTE — Telephone Encounter (Signed)
Patient has med check Wed, wanted to know if she should come tomorrow for lab-if so can you please order and I will call her back and schedule.  Also-see prior MyChart note she sent about being on prednisone just an FYI. Thanks.

## 2020-05-30 NOTE — Progress Notes (Signed)
Chief Complaint  Patient presents with  . Hypertension    Nonfasting med check. Did not get shingrix yet. Not interested in doing pneumonia vaccine while on prednisone.    She recently saw ortho for severe R leg pain (PA at Dr. Archie Endo office), felt to be coming from her back.  She was started on a prednisone taper, and is now getting PT (started PT earlier this week).  Pain is improving.  She started with pain about a month ago, felt like her hamstring initially. Got a little better with stretches, icing.  Then spread to the entire leg, had numbness in her foot.  Xrays suggested "one disc was gone", and a small hairline fracture in a vertebra.  She has never had any back pain.  Blue Emu helps the muscles feel better, and heat helps.   Hypertension:  She is currently taking losartan 175m daily.   BP's are running up to 128/70's, usually lower. She reports compliance with medications(losartan, also on lasix for Meniere's), follow low sodium diet. Denies headaches, dizziness, chest pain, edema, shortness of breath. Muscle cramping only on the RLE per above, none on the left.  Mixed hyperlipidemia: She has been trying to follow a lowfat diet. She only rarely eats fried foods. She reports compliance with her medications (Crestor, fenofibrate and fish oil, 4x/d), and hasno side effects.TG were just mildly elevated on last check. Lab Results  Component Value Date   CHOL 144 11/29/2019   HDL 38 (L) 11/29/2019   LDLCALC 78 11/29/2019   TRIG 162 (H) 11/29/2019   CHOLHDL 3.8 11/29/2019   Meniere's disease--She istakingfurosemide (and potassium) without side effects or muscle cramps (except as related to the back, affecting her RLE). Hasn't had any flares of vertigo in the last year. She has decreased hearing in the left ear and uses a hearing aid. She is under the care ofDr. TBenjamine Mola sees him yearly.  Gout--no flare in a few years. No side effects to allopurinol. Lab Results  Component  Value Date   LABURIC 5.6 11/29/2019   Hypothyroidism: She is compliant with taking her Synthroid on empty stomach, separate from other medications. She never really had any symptoms related to her thyroid. She denies any changes in hair/skin/bowels/moods. She denies any missed doses. Lab Results  Component Value Date   TSH 2.450 11/29/2019   Prediabetes--A1c had been up to 5.9 in 04/2015.IMamie Nickbeen normal since then.She has tried to cut back on carbs, and walk more.Doesn't drink regular soda, sweet tea.  Osteopenia: She took alendronate from 2013 until her last DEXA in 04/2018 (T-1.7 R fem neck).She had recheck of DEXA in 04/2020, two years after stopping bisphosphonate.  T was -1.5 at R femur neck (stable; was -1.7 in 04/2018).  Shedrinks milk, occasional cheese.She has Activiayogurtonce or twice daily.She is taking vitamin D 1000 IU daily. Getting weight-bearing exercise (using weights while watching TV at night, 3-4x/week). Stopped this exercise related to her recent leg pain/back issues.  Atherosclerotic plaque was noted bilaterally on carotid UKoreadone 12/2018.  There was no significant stenosis. She was also noted to have aortic atherosclerosis, on [place]done to screen for AAA) in 2017. She takes a statin and ASA 852mdaily.  She denies any neuro symptoms. Denies any bleeding or GI complaints.  Widowed last year.  Did "retail therapy" with her daughter on what would have been her anniversary.  Overall doing well.  [place]   PMH, PSH, SH reviewed  Outpatient Encounter Medications as of 05/31/2020  Medication Sig Note  . allopurinol (ZYLOPRIM) 100 MG tablet TAKE 1 TABLET(100 MG) BY MOUTH DAILY   . aspirin EC 81 MG tablet Take 81 mg by mouth daily.   . cholecalciferol (VITAMIN D3) 25 MCG (1000 UNIT) tablet Take 1,000 Units by mouth daily.   . fenofibrate 54 MG tablet TAKE 1 TABLET(54 MG) BY MOUTH DAILY   . furosemide (LASIX) 20  MG tablet Take 1 tablet (20 mg total) by mouth daily.   Marland Kitchen losartan (COZAAR) 100 MG tablet TAKE 1 TABLET BY MOUTH DAILY   . Omega-3 Fatty Acids (FISH OIL) 1200 MG CAPS Take 4 capsules by mouth daily.    . potassium chloride SA (KLOR-CON) 20 MEQ tablet TAKE 1 TABLET(20 MEQ) BY MOUTH DAILY   . predniSONE (STERAPRED UNI-PAK 48 TAB) 10 MG (48) TBPK tablet    . rosuvastatin (CRESTOR) 20 MG tablet TAKE 1 TABLET(20 MG) BY MOUTH DAILY   . SYNTHROID 75 MCG tablet TAKE 1 TABLET (75 MCG TOTAL) DAILY BEFORE BREAKFAST.   . Travoprost, BAK Free, (TRAVATAN) 0.004 % SOLN ophthalmic solution Place 1 drop into both eyes at bedtime.   . [DISCONTINUED] latanoprost (XALATAN) 0.005 % ophthalmic solution 1 drop at bedtime.   . hydrocortisone 2.5 % cream Apply to affected areas of skin twice daily as needed for rash/itching (Patient not taking: Reported on 05/31/2020) 11/14/2016: Uses to vaginal area 2x/week prn  . [DISCONTINUED] omeprazole (PRILOSEC) 20 MG capsule Take 20 mg by mouth daily. (Patient not taking: Reported on 12/01/2019) 10/10/2014: Only takes prn  . [DISCONTINUED] predniSONE (STERAPRED UNI-PAK 21 TAB) 10 MG (21) TBPK tablet Take by mouth.    Facility-Administered Encounter Medications as of 05/31/2020  Medication  . influenza  inactive virus vaccine (FLUZONE/FLUARIX) injection 0.5 mL   Allergies  Allergen Reactions  . Lisinopril Cough  . Penicillins Rash    ROS: no fever, chills, headaches, dizziness, URI symptoms, cough, shortness of breath, chest pain, palpitations, heartburn.  Denies urinary complaints, bleeding, bruising, rash. No thyroid symptoms. Moods are good. Slight constipation since on prednisone/leg pain. R Leg pain, per HPI.  No back pain. No weakness.  See HPI   PHYSICAL EXAM:  BP 130/80   Pulse 80   Ht '5\' 1"'  (1.549 m)   Wt 153 lb 9.6 oz (69.7 kg)   BMI 29.02 kg/m   Wt Readings from Last 3 Encounters:  05/31/20 153 lb 9.6 oz (69.7 kg)  12/01/19 154 lb 6.4 oz (70 kg)  09/06/19  155 lb 9.6 oz (70.6 kg)   Well appearing, pleasant female in no distress HEENT: conjunctiva and sclera clear, EOMI, wearing mask Neck: no lymphadenopathy or mass, no bruit. Heart: regular rate and rhythm Lungs: clear bilaterally Back: no CVAor spinaltenderness, no SI tenderness. Abdomen: soft, nontender, no mass Extremities: no edema, normal pulses Skin: normal turgor, no rash/lesions Psych: normal mood, affect, hygiene and grooming Neuro: alert and oriented, normal gait. DTR's 2+ and symmetric.  Normal strength, sensation. Negative SLR. No pain with pyriformis stretch (though limited ROM bilaterally).    ASSESSMENT/PLAN:  Essential hypertension, benign - well controlled, cont losartan. Cont low Na diet, regular exercise (when able) - Plan: Comprehensive metabolic panel  Hypothyroidism, unspecified type - euthyroid by history, last TSH normal. Cont current dose  Osteopenia, unspecified location - stable DEXA, 2 years after completing alendronate course.  Recheck in 2 years. Discussed Ca recommendations, D, wt-bearing exercise  Aortic atherosclerosis ([place]) - cont statin  Mixed hyperlipidemia -  improved, borderline (TG mildly elevated, low HDL). Continue current meds. reviewed lowfat diet - Plan: fenofibrate 54 MG tablet, rosuvastatin (CRESTOR) 20 MG tablet  Meniere disease, left - stable on current regimen without any vertigo; stable hearing loss.  - Plan: furosemide (LASIX) 20 MG tablet, potassium chloride SA (KLOR-CON) 20 MEQ tablet  Chronic gout without tophus, unspecified cause, unspecified site - no flares, uric acid at goal on last check; cont allopurinol - Plan: allopurinol (ZYLOPRIM) 100 MG tablet  Need for hepatitis C screening test - Plan: Hepatitis C antibody  Medication monitoring encounter - Plan: Comprehensive metabolic panel  Lumbar radiculopathy - per pt, under care of ortho. Improving with steroids and PT. Normal neuro exam, neg SLR. DDx reviewed  Vaccine  counseling - pneumovax booster and shingrix were discussed and recommended. She prefers to wait until back is better  Counseled re: current aspirin guidelines, risks vs benefit. She has no SE, bruising, and plans to continue.  F/u as scheduled in September with fasting labs prior. c-met, lipids, TSH, CBC, uric acid

## 2020-05-31 ENCOUNTER — Encounter: Payer: Self-pay | Admitting: Family Medicine

## 2020-05-31 ENCOUNTER — Ambulatory Visit: Payer: Medicare PPO | Admitting: Family Medicine

## 2020-05-31 ENCOUNTER — Other Ambulatory Visit: Payer: Self-pay

## 2020-05-31 VITALS — BP 130/80 | HR 80 | Ht 61.0 in | Wt 153.6 lb

## 2020-05-31 DIAGNOSIS — M5416 Radiculopathy, lumbar region: Secondary | ICD-10-CM

## 2020-05-31 DIAGNOSIS — Z1159 Encounter for screening for other viral diseases: Secondary | ICD-10-CM

## 2020-05-31 DIAGNOSIS — M1A9XX0 Chronic gout, unspecified, without tophus (tophi): Secondary | ICD-10-CM

## 2020-05-31 DIAGNOSIS — H8102 Meniere's disease, left ear: Secondary | ICD-10-CM

## 2020-05-31 DIAGNOSIS — I10 Essential (primary) hypertension: Secondary | ICD-10-CM

## 2020-05-31 DIAGNOSIS — I700 Atherosclerosis of aorta: Secondary | ICD-10-CM

## 2020-05-31 DIAGNOSIS — Z5181 Encounter for therapeutic drug level monitoring: Secondary | ICD-10-CM | POA: Diagnosis not present

## 2020-05-31 DIAGNOSIS — M8580 Other specified disorders of bone density and structure, unspecified site: Secondary | ICD-10-CM | POA: Diagnosis not present

## 2020-05-31 DIAGNOSIS — E039 Hypothyroidism, unspecified: Secondary | ICD-10-CM

## 2020-05-31 DIAGNOSIS — E782 Mixed hyperlipidemia: Secondary | ICD-10-CM | POA: Diagnosis not present

## 2020-05-31 DIAGNOSIS — Z7185 Encounter for immunization safety counseling: Secondary | ICD-10-CM

## 2020-05-31 MED ORDER — ROSUVASTATIN CALCIUM 20 MG PO TABS: 20.0000 mg | ORAL_TABLET | Freq: Every day | ORAL | 1 refills | Status: DC

## 2020-05-31 MED ORDER — FENOFIBRATE 54 MG PO TABS: ORAL_TABLET | ORAL | 1 refills | Status: DC

## 2020-05-31 MED ORDER — ALLOPURINOL 100 MG PO TABS: 100.0000 mg | ORAL_TABLET | Freq: Every day | ORAL | 1 refills | Status: DC

## 2020-05-31 MED ORDER — POTASSIUM CHLORIDE CRYS ER 20 MEQ PO TBCR: EXTENDED_RELEASE_TABLET | ORAL | 1 refills | Status: DC

## 2020-05-31 MED ORDER — FUROSEMIDE 20 MG PO TABS: 20.0000 mg | ORAL_TABLET | Freq: Every day | ORAL | 1 refills | Status: DC

## 2020-05-31 NOTE — Patient Instructions (Signed)
Continue current medications. Consider pneumovax booster. Shingles vaccine from pharmacy when you are able to.  Continue physical therapy.  Remember when you see your lab results that your glucose is influenced by the fact that you were NOT fasting, and on prednisone.

## 2020-06-01 ENCOUNTER — Other Ambulatory Visit: Payer: Self-pay | Admitting: Family Medicine

## 2020-06-01 DIAGNOSIS — E782 Mixed hyperlipidemia: Secondary | ICD-10-CM

## 2020-06-01 DIAGNOSIS — H8102 Meniere's disease, left ear: Secondary | ICD-10-CM

## 2020-06-01 DIAGNOSIS — M1A9XX0 Chronic gout, unspecified, without tophus (tophi): Secondary | ICD-10-CM

## 2020-06-01 LAB — COMPREHENSIVE METABOLIC PANEL
ALT: 19 IU/L (ref 0–32)
AST: 20 IU/L (ref 0–40)
Albumin/Globulin Ratio: 1.8 (ref 1.2–2.2)
Albumin: 4.3 g/dL (ref 3.7–4.7)
Alkaline Phosphatase: 40 IU/L — ABNORMAL LOW (ref 44–121)
BUN/Creatinine Ratio: 32 — ABNORMAL HIGH (ref 12–28)
BUN: 32 mg/dL — ABNORMAL HIGH (ref 8–27)
Bilirubin Total: 0.4 mg/dL (ref 0.0–1.2)
CO2: 20 mmol/L (ref 20–29)
Calcium: 10.4 mg/dL — ABNORMAL HIGH (ref 8.7–10.3)
Chloride: 103 mmol/L (ref 96–106)
Creatinine, Ser: 0.99 mg/dL (ref 0.57–1.00)
Globulin, Total: 2.4 g/dL (ref 1.5–4.5)
Glucose: 131 mg/dL — ABNORMAL HIGH (ref 65–99)
Potassium: 4.6 mmol/L (ref 3.5–5.2)
Sodium: 141 mmol/L (ref 134–144)
Total Protein: 6.7 g/dL (ref 6.0–8.5)
eGFR: 58 mL/min/{1.73_m2} — ABNORMAL LOW (ref >59)

## 2020-06-01 LAB — HEPATITIS C ANTIBODY: Hep C Virus Ab: <0.1 s/co ratio (ref 0.0–0.9)

## 2020-06-05 DIAGNOSIS — M5416 Radiculopathy, lumbar region: Secondary | ICD-10-CM | POA: Diagnosis not present

## 2020-06-08 DIAGNOSIS — M5416 Radiculopathy, lumbar region: Secondary | ICD-10-CM | POA: Diagnosis not present

## 2020-06-12 DIAGNOSIS — M5416 Radiculopathy, lumbar region: Secondary | ICD-10-CM | POA: Diagnosis not present

## 2020-06-15 DIAGNOSIS — M5416 Radiculopathy, lumbar region: Secondary | ICD-10-CM | POA: Diagnosis not present

## 2020-06-19 DIAGNOSIS — M545 Low back pain, unspecified: Secondary | ICD-10-CM | POA: Diagnosis not present

## 2020-06-20 DIAGNOSIS — M5416 Radiculopathy, lumbar region: Secondary | ICD-10-CM | POA: Diagnosis not present

## 2020-06-21 DIAGNOSIS — M545 Low back pain, unspecified: Secondary | ICD-10-CM | POA: Diagnosis not present

## 2020-06-22 DIAGNOSIS — M5416 Radiculopathy, lumbar region: Secondary | ICD-10-CM | POA: Diagnosis not present

## 2020-06-26 DIAGNOSIS — M5416 Radiculopathy, lumbar region: Secondary | ICD-10-CM | POA: Diagnosis not present

## 2020-06-27 DIAGNOSIS — M5416 Radiculopathy, lumbar region: Secondary | ICD-10-CM | POA: Diagnosis not present

## 2020-07-05 DIAGNOSIS — M5416 Radiculopathy, lumbar region: Secondary | ICD-10-CM | POA: Diagnosis not present

## 2020-07-12 DIAGNOSIS — M5416 Radiculopathy, lumbar region: Secondary | ICD-10-CM | POA: Diagnosis not present

## 2020-07-13 DIAGNOSIS — M5416 Radiculopathy, lumbar region: Secondary | ICD-10-CM | POA: Diagnosis not present

## 2020-08-30 ENCOUNTER — Other Ambulatory Visit: Payer: Self-pay | Admitting: Family Medicine

## 2020-08-30 DIAGNOSIS — I10 Essential (primary) hypertension: Secondary | ICD-10-CM

## 2020-10-31 ENCOUNTER — Other Ambulatory Visit: Payer: Self-pay | Admitting: Family Medicine

## 2020-10-31 DIAGNOSIS — E039 Hypothyroidism, unspecified: Secondary | ICD-10-CM

## 2020-11-21 DIAGNOSIS — M5416 Radiculopathy, lumbar region: Secondary | ICD-10-CM | POA: Diagnosis not present

## 2020-11-21 DIAGNOSIS — Z92241 Personal history of systemic steroid therapy: Secondary | ICD-10-CM

## 2020-11-21 HISTORY — DX: Personal history of systemic steroid therapy: Z92.241

## 2020-11-28 ENCOUNTER — Other Ambulatory Visit: Payer: Self-pay | Admitting: Family Medicine

## 2020-11-28 DIAGNOSIS — H8102 Meniere's disease, left ear: Secondary | ICD-10-CM

## 2020-11-28 DIAGNOSIS — I10 Essential (primary) hypertension: Secondary | ICD-10-CM

## 2020-11-28 DIAGNOSIS — E782 Mixed hyperlipidemia: Secondary | ICD-10-CM

## 2020-11-28 DIAGNOSIS — M1A9XX0 Chronic gout, unspecified, without tophus (tophi): Secondary | ICD-10-CM

## 2020-12-01 ENCOUNTER — Other Ambulatory Visit: Payer: Self-pay

## 2020-12-01 ENCOUNTER — Other Ambulatory Visit: Payer: Medicare PPO

## 2020-12-01 DIAGNOSIS — E039 Hypothyroidism, unspecified: Secondary | ICD-10-CM | POA: Diagnosis not present

## 2020-12-01 DIAGNOSIS — E782 Mixed hyperlipidemia: Secondary | ICD-10-CM

## 2020-12-01 DIAGNOSIS — M1A9XX0 Chronic gout, unspecified, without tophus (tophi): Secondary | ICD-10-CM

## 2020-12-01 DIAGNOSIS — Z5181 Encounter for therapeutic drug level monitoring: Secondary | ICD-10-CM | POA: Diagnosis not present

## 2020-12-01 DIAGNOSIS — I10 Essential (primary) hypertension: Secondary | ICD-10-CM

## 2020-12-02 LAB — COMPREHENSIVE METABOLIC PANEL
ALT: 17 IU/L (ref 0–32)
AST: 18 IU/L (ref 0–40)
Albumin/Globulin Ratio: 1.9 (ref 1.2–2.2)
Albumin: 4.4 g/dL (ref 3.7–4.7)
Alkaline Phosphatase: 33 IU/L — ABNORMAL LOW (ref 44–121)
BUN/Creatinine Ratio: 38 — ABNORMAL HIGH (ref 12–28)
BUN: 40 mg/dL — ABNORMAL HIGH (ref 8–27)
Bilirubin Total: 0.5 mg/dL (ref 0.0–1.2)
CO2: 21 mmol/L (ref 20–29)
Calcium: 9.9 mg/dL (ref 8.7–10.3)
Chloride: 100 mmol/L (ref 96–106)
Creatinine, Ser: 1.06 mg/dL — ABNORMAL HIGH (ref 0.57–1.00)
Globulin, Total: 2.3 g/dL (ref 1.5–4.5)
Glucose: 91 mg/dL (ref 65–99)
Potassium: 4.5 mmol/L (ref 3.5–5.2)
Sodium: 137 mmol/L (ref 134–144)
Total Protein: 6.7 g/dL (ref 6.0–8.5)
eGFR: 53 mL/min/{1.73_m2} — ABNORMAL LOW (ref >59)

## 2020-12-02 LAB — CBC WITH DIFFERENTIAL/PLATELET
Basophils Absolute: 0.1 10*3/uL (ref 0.0–0.2)
Basos: 1 %
EOS (ABSOLUTE): 0.3 10*3/uL (ref 0.0–0.4)
Eos: 5 %
Hematocrit: 39.8 % (ref 34.0–46.6)
Hemoglobin: 12.8 g/dL (ref 11.1–15.9)
Immature Grans (Abs): 0 10*3/uL (ref 0.0–0.1)
Immature Granulocytes: 1 %
Lymphocytes Absolute: 2 10*3/uL (ref 0.7–3.1)
Lymphs: 33 %
MCH: 29.4 pg (ref 26.6–33.0)
MCHC: 32.2 g/dL (ref 31.5–35.7)
MCV: 91 fL (ref 79–97)
Monocytes Absolute: 0.7 10*3/uL (ref 0.1–0.9)
Monocytes: 12 %
Neutrophils Absolute: 2.9 10*3/uL (ref 1.4–7.0)
Neutrophils: 48 %
Platelets: 213 10*3/uL (ref 150–450)
RBC: 4.36 x10E6/uL (ref 3.77–5.28)
RDW: 12.9 % (ref 11.7–15.4)
WBC: 6 10*3/uL (ref 3.4–10.8)

## 2020-12-02 LAB — LIPID PANEL
Chol/HDL Ratio: 4.2 ratio (ref 0.0–4.4)
Cholesterol, Total: 158 mg/dL (ref 100–199)
HDL: 38 mg/dL — ABNORMAL LOW (ref >39)
LDL Chol Calc (NIH): 84 mg/dL (ref 0–99)
Triglycerides: 214 mg/dL — ABNORMAL HIGH (ref 0–149)
VLDL Cholesterol Cal: 36 mg/dL (ref 5–40)

## 2020-12-02 LAB — URIC ACID: Uric Acid: 6 mg/dL (ref 3.1–7.9)

## 2020-12-02 LAB — TSH: TSH: 1.41 u[IU]/mL (ref 0.450–4.500)

## 2020-12-05 NOTE — Patient Instructions (Addendum)
HEALTH MAINTENANCE RECOMMENDATIONS:  It is recommended that you get at least 30 minutes of aerobic exercise at least 5 days/week (for weight loss, you may need as much as 60-90 minutes). This can be any activity that gets your heart rate up. This can be divided in 10-15 minute intervals if needed, but try and build up your endurance at least once a week.  Weight bearing exercise is also recommended twice weekly.  Eat a healthy diet with lots of vegetables, fruits and fiber.  "Colorful" foods have a lot of vitamins (ie green vegetables, tomatoes, red peppers, etc).  Limit sweet tea, regular sodas and alcoholic beverages, all of which has a lot of calories and sugar.  Up to 1 alcoholic drink daily may be beneficial for women (unless trying to lose weight, watch sugars).  Drink a lot of water.  Calcium recommendations are 1200-1500 mg daily (1500 mg for postmenopausal women or women without ovaries), and vitamin D 1000 IU daily.  This should be obtained from diet and/or supplements (vitamins), and calcium should not be taken all at once, but in divided doses.  Monthly self breast exams and yearly mammograms for women over the age of 17 is recommended.  Sunscreen of at least SPF 30 should be used on all sun-exposed parts of the skin when outside between the hours of 10 am and 4 pm (not just when at beach or pool, but even with exercise, golf, tennis, and yard work!)  Use a sunscreen that says "broad spectrum" so it covers both UVA and UVB rays, and make sure to reapply every 1-2 hours.  Remember to change the batteries in your smoke detectors when changing your clock times in the spring and fall. Carbon monoxide detectors are recommended for your home.  Use your seat belt every time you are in a car, and please drive safely and not be distracted with cell phones and texting while driving.   Kaitlyn Abbott , Thank you for taking time to come for your Medicare Wellness Visit. I appreciate your ongoing  commitment to your health goals. Please review the following plan we discussed and let me know if I can assist you in the future.    Kaitlyn Abbott , Thank you for taking time to come for your Medicare Wellness Visit. I appreciate your ongoing commitment to your health goals. Please review the following plan we discussed and let me know if I can assist you in the future.    This is a list of the screening recommended for you and due dates:  Health Maintenance  Topic Date Due   Zoster (Shingles) Vaccine (1 of 2) Never done   COVID-19 Vaccine (4 - Booster for Pfizer series) 05/30/2020   Flu Shot  10/30/2020   Tetanus Vaccine  03/23/2022   DEXA scan (bone density measurement)  Completed   Hepatitis C Screening: USPSTF Recommendation to screen - Ages 73-79 yo.  Completed   Pneumonia vaccines  Completed   HPV Vaccine  Aged Out   Continue yearly mammograms.  Continue yearly high dose flu shots. We discussed updating pneumonia vaccine (pneumovax 23).    I recommend you get the bivalent COVID booster which should be coming out soon.  This needs to be separated from other vaccines by 2 months, and separated from other COVID boosters by 2 months.  Pay attention to the news for when this becomes available.  Schedule a nurse visit for the end of October for Flu and COVID booster together, then  2 weeks later return for the pneumovax.   I recommend getting the new shingles vaccine (Shingrix). Since you have Medicare, you will need to get this from the pharmacy, as it is covered by Part D. This is a series of 2 injections, spaced 2 months apart.   This should be separated from other vaccines by at least 2 weeks.  Get meclizine over-the-counter (12.5 or 25mg ) to have on hand in case you have a flare of vertigo while traveling. (Don't mix with alcohol, may be sedating).

## 2020-12-05 NOTE — Progress Notes (Signed)
Chief Complaint  Patient presents with   Medicare Wellness    Nonfasting AWV/CPE labs already done. No new concerns. Does not want any vaccines today, will discuss when she returns from her trip. Has only has had one covid booster.     Kaitlyn Abbott is a 80 y.o. female who presents for annual physical exam, Medicare wellness visit and follow-up on chronic medical conditions. She had labs drawn prior to her visit, see below.  She leaves in 9 days for a girls trip to [place], [place], [place] (x2wks), then to [place] for 2 weeks. Doesn't want any vaccines prior to going. She did get special COVID insurance.  Hypertension:  She is currently taking losartan 129m daily and denies side effects (also on furosemide for Meniere's). She follows a low sodium diet. BP's are  120's/70's [facility] Den[facility] headaches, dizziness, chest pain, edema, shortness of breath.  Sciatica--She had 2 injections at [facility] with Dr. WThedore Mins(in March and August). She states at some point she had a minor fracture. MRI and records not received from that office (only initial visit in 05/2020).  She hasn't had any sciatica since the injections. She had complete benefit from first injection, went back when symptoms recurred (were mild, but went due to upcoming travel out of the country).  Told to follow-up prn for further injections.  Mixed hyperlipidemia:  She has been trying to follow a lowfat diet. She only rarely eats fried foods (had burgers in early August when at her son's).  She reports compliance with her medications (Crestor, fenofibrate and fish oil, 4x/d), and has no side effects.  Hasn't had any alcohol in the last 2 months (limits as it flares Meniere's).   Meniere's disease--She is taking furosemide (and potassium) without side effects or muscle cramps. Hasn't had any flares of vertigo in the last few years. She has decreased hearing in the left ear and uses a hearing aid.   She is under the care of Dr. TBenjamine Mola sees him yearly.   Gout--no flare in a few years.  No side effects to allopurinol.   Hypothyroidism:  She is compliant with taking her Synthroid on empty stomach, separate from other medications. She never really had any symptoms related to her thyroid. She denies any changes in hair (slight thinning from age)/skin/bowels/moods. She denies any missed doses.    Prediabetes--A1c had been up to 5.9 in 04/2015, has been normal since then.  She has tried to cut back on carbs, and walk more. Doesn't drink regular soda, sweet tea.  Osteopenia:  She took alendronate from 2013 until her DEXA in 04/2018 (T-1.7 R fem neck).  She had recheck of DEXA in 04/2020, two years after stopping bisphosphonate.  T was -1.5 at R femur neck (stable; was -1.7 in 04/2018). She drinks milk, occasional cheese. She has Activia yogurt once or twice daily.  She is taking vitamin D 1000 IU daily. Getting weight-bearing exercise (using weights while watching TV at night, 3x/week).   Atherosclerotic plaque was noted bilaterally on carotid UKoreadone 12/2018.  There was no significant stenosis. She was also noted to have aortic atherosclerosis, on [place](done to screen for AAA) in 2017. She takes a statin (aspirin was stopped per new guidelines).  She denies any neuro symptoms  Immunization History  Administered Date(s) Administered   Fluad Quad(high Dose 65+) 12/24/2018, 01/17/2020   Influenza Split 11/29/2010, 01/14/2012   Influenza, High Dose Seasonal PF 01/26/2013, 12/30/2013, 01/12/2015, 01/17/2016,  01/21/2017, 12/25/2017   PFIZER(Purple Top)SARS-COV-2 Vaccination 05/06/2019, 05/27/2019, 01/31/2020   Pneumococcal Conjugate-13 10/06/2013   Pneumococcal Polysaccharide-23 01/24/2010   Tdap 03/23/2012   Zoster, Live 04/21/2015   Last Pap smear: 09/2015, normal, with no high risk HPV Last mammogram: 01/2020 Last colonoscopy: 12/2017 Dr. Watt Climes; int/ext hemorrhoids, diverticulosis (she had +Cologuard  10/2017) Last DEXA:  04/2020  T was -1.5 at R femur neck  Dentist: regularly, twice yearly  Ophtho: yearly Exercise:  Walking some. Typical routine is walking with a friend 3-4 days/week (every other day), for 45-60 mins (2.5-3 miles). Uses her handweights 3 days/week.  AAA screen negative 09/2015   Other doctors caring for patient include: Ophtho: Dr. Laban Emperor at [facility] (and previously Dr. Tommy Rainwater) Dentist: Dr. Imagene Riches Periodontist: Dr. Satira Sark Ortho: Dr. Noemi Chapel (and Dr. Thedore Mins for epidural injections) GI:  Dr. Watt Climes Derm: Dr. Derrel Nip ([place]) Podiatrist: Dr. Paulla Dolly ENT: Dr. Benjamine Mola     Depression Screening: Flowsheet Row Office Visit from 12/06/2020 in [place]  PHQ-2 Total Score 0        Falls screen:  Fall Risk  12/06/2020 05/31/2020 12/01/2019 11/25/2018 11/12/2017  Falls in the past year? 0 0 0 0 No  Number falls in past yr: 0 - - - -  Injury with Fall? 0 - - - -  Risk for fall due to : No Fall Risks - - - -  Follow up Falls evaluation completed - - - -     Functional Status Survey: Is the patient deaf or have difficulty hearing?: Yes (left ear) Does the patient have difficulty seeing, even when wearing glasses/contacts?: No Does the patient have difficulty concentrating, remembering, or making decisions?: Yes (short term) Does the patient have difficulty walking or climbing stairs?: No Does the patient have difficulty dressing or bathing?: No Does the patient have difficulty doing errands alone such as visiting a doctor's office or shopping?: No  No changes in memory.  Mini-Cog:   Mini-Cog - 12/06/20 1342     Normal clock drawing test? yes    How many words correct? 3               End of Life Discussion:  Patient has a living will and medical power of attorney;  she brought in updated forms today.  PMH, PSH, SH and FH reviewed and updated  Outpatient Encounter Medications as of 12/06/2020  Medication Sig Note   allopurinol (ZYLOPRIM) 100  MG tablet Take 1 tablet (100 mg total) by mouth daily.    cholecalciferol (VITAMIN D3) 25 MCG (1000 UNIT) tablet Take 1,000 Units by mouth daily.    fenofibrate 54 MG tablet TAKE 1 TABLET(54 MG) BY MOUTH DAILY    furosemide (LASIX) 20 MG tablet Take 1 tablet (20 mg total) by mouth daily.    losartan (COZAAR) 100 MG tablet TAKE 1 TABLET BY MOUTH DAILY    Omega-3 Fatty Acids (FISH OIL) 1200 MG CAPS Take 4 capsules by mouth daily.  12/06/2020: Takes 2 BID   potassium chloride SA (KLOR-CON) 20 MEQ tablet TAKE 1 TABLET(20 MEQ) BY MOUTH DAILY    rosuvastatin (CRESTOR) 20 MG tablet Take 1 tablet (20 mg total) by mouth daily.    SYNTHROID 75 MCG tablet TAKE 1 TABLET (75 MCG TOTAL) DAILY BEFORE BREAKFAST.    Travoprost, BAK Free, (TRAVATAN) 0.004 % SOLN ophthalmic solution Place 1 drop into both eyes at bedtime.    [DISCONTINUED] aspirin EC 81 MG tablet Take 81 mg by mouth daily.    [  DISCONTINUED] hydrocortisone 2.5 % cream Apply to affected areas of skin twice daily as needed for rash/itching (Patient not taking: Reported on 05/31/2020) 11/14/2016: Uses to vaginal area 2x/week prn   [DISCONTINUED] predniSONE (STERAPRED UNI-PAK 48 TAB) 10 MG (48) TBPK tablet     Facility-Administered Encounter Medications as of 12/06/2020  Medication   influenza  inactive virus vaccine (FLUZONE/FLUARIX) injection 0.5 mL   Allergies  Allergen Reactions   Lisinopril Cough   Penicillins Rash     ROS: The patient denies anorexia, fever, headaches; vision changes, decreased hearing (diminished on left, chronic/unchanged), ear pain, sore throat, breast concerns, chest pain, palpitations, dizziness, syncope, dyspnea on exertion, cough, swelling, nausea, vomiting, diarrhea, constipation, abdominal pain, melena, hematochezia, indigestion/heartburn, hematuria, incontinence, dysuria, vaginal bleeding, discharge, or odor; slight external vaginal itch intermittently (relieved by topical cream); denies genital lesions, numbness,  tingling, weakness, tremor, depression, anxiety, abnormal bleeding/bruising, or enlarged lymph nodes.   +arthritis in her hands, stiff in the morning.  No gout flares. Back/leg pain resolved with injection in August.    PHYSICAL EXAM:  BP 122/78   Pulse 72   Ht '5\' 1"'  (1.549 m)   Wt 150 lb (68 kg)   BMI 28.34 kg/m   Wt Readings from Last 3 Encounters:  12/06/20 150 lb (68 kg)  05/31/20 153 lb 9.6 oz (69.7 kg)  12/01/19 154 lb 6.4 oz (70 kg)    General Appearance:    Alert, cooperative, no distress, appears stated age   Head:    Normocephalic, without obvious abnormality, atraumatic     Eyes:    PERRL, conjunctiva/corneas clear, EOM's intact, fundi benign     Ears:    Normal TMs and external ears bilaterally  Nose:    Not examined (wearing mask due to COVID-19 pandemic)  Throat:    Not examined (wearing mask due to COVID-19 pandemic)  Neck:    Supple, no lymphadenopathy; thyroid: no enlargement/ tenderness/nodules; no carotid bruit or JVD.    Back:    Spine nontender, no curvature, ROM normal, no CVA tenderness     Lungs:    Clear to auscultation bilaterally without wheezes, rales or ronchi; respirations unlabored     Chest Wall:    No tenderness or deformity     Heart:    Regular rate and rhythm, S1 and S2 normal, no murmur, rub or gallop     Breast Exam:    No tenderness, masses, or nipple discharge or inversion. No axillary lymphadenopathy     Abdomen:    Soft, non-tender, nondistended, normoactive bowel sounds, no masses, no hepatosplenomegaly     Genitalia:    Normal external genitalia without lesions. Mild atrophic changes. BUS and vagina normal; no  cervical motion tenderness. No abnormal vaginal discharge. Uterus and adnexa not enlarged, nontender, no masses. Pap not performed     Rectal:    Normal tone, no masses or tenderness; guaiac negative stool     Extremities:    No clubbing, cyanosis or edema.    Pulses:    2+ and symmetric all extremities     Skin:    Skin color,  texture, turgor normal, no rashes.   Lymph nodes:    Cervical, supraclavicular, and axillary nodes normal     Neurologic:    Normal strength, sensation and gait; reflexes 2+ and symmetric throughout                        Psych:  Normal mood, affect, hygiene and grooming     Chemistry      Component Value Date/Time   NA 137 12/01/2020 0933   K 4.5 12/01/2020 0933   CL 100 12/01/2020 0933   CO2 21 12/01/2020 0933   BUN 40 (H) 12/01/2020 0933   CREATININE 1.06 (H) 12/01/2020 0933   CREATININE 0.95 (H) 11/12/2016 0920      Component Value Date/Time   CALCIUM 9.9 12/01/2020 0933   ALKPHOS 33 (L) 12/01/2020 0933   AST 18 12/01/2020 0933   ALT 17 12/01/2020 0933   BILITOT 0.5 12/01/2020 0933     Fasting glucose 91  Lab Results  Component Value Date   WBC 6.0 12/01/2020   HGB 12.8 12/01/2020   HCT 39.8 12/01/2020   MCV 91 12/01/2020   PLT 213 12/01/2020   Lab Results  Component Value Date   TSH 1.410 12/01/2020   Lab Results  Component Value Date   LABURIC 6.0 12/01/2020   Lab Results  Component Value Date   CHOL 158 12/01/2020   HDL 38 (L) 12/01/2020   LDLCALC 84 12/01/2020   TRIG 214 (H) 12/01/2020   CHOLHDL 4.2 12/01/2020     ASSESSMENT/PLAN:  Annual physical exam  Medicare annual wellness visit, subsequent  Osteopenia, unspecified location - off bisphosphonate x 2 years and DEXA stable in 04/2020. Cont Ca, weight-bearing exercise, vit D  Aortic atherosclerosis ([place]) - continue statin  Chronic gout without tophus, unspecified cause, unspecified site - no flares, uric acid at goal on last check; cont allopurinol - Plan: allopurinol (ZYLOPRIM) 100 MG tablet  Mixed hyperlipidemia - TG higher than prev, LDL at goal. Continue current meds. reviewed lowfat diet - Plan: fenofibrate 54 MG tablet, rosuvastatin (CRESTOR) 20 MG tablet, Lipid panel  Meniere disease, left - stable on current regimen without any vertigo; stable hearing loss.  - Plan: furosemide  (LASIX) 20 MG tablet, potassium chloride SA (KLOR-CON) 20 MEQ tablet  Essential hypertension, benign - Well controlled on current regimen - Plan: losartan (COZAAR) 100 MG tablet  Hypothyroidism, unspecified type - adequately replaced, cont current dose - Plan: SYNTHROID 75 MCG tablet  Medication monitoring encounter - Plan: Comprehensive metabolic panel, Lipid panel  Vaccine counseling - declines today; discussed high dose flu, new COVID booster, pneumovax and shingrix recs   Vaccines end of October when returns from trip--flu and COVID (new booster) together, f/b pneumovax 2 weeks later (vs at 6 month f/u).  Also reminded to get Shingrix from pharmacy.   Discussed monthly self breast exams and yearly mammograms; at least 30 minutes of aerobic activity at least 5 days/week, weight-bearing exercise at least 2x/wk; proper sunscreen use reviewed; healthy diet, including goals of calcium and vitamin D intake and alcohol recommendations (less than or equal to 1 drink/day) reviewed; regular seatbelt use; changing batteries in smoke detectors. Immunization recommendations discussed--continue yearly high dose flu shots. Shingrix recommended, risks/side effects reviewed, to get from pharmacy. Discussed pneumovax booster, and new COVID booster. Colonoscopy recommendations reviewed--UTD.  DEXA--consider 04/2022 or 2025   MOST form reviewed and updated. Full code, Full Care    F/u 6 months, c-met and lipids prior to visit.    Medicare Attestation I have personally reviewed: The patient's medical and social history Their use of alcohol, tobacco or illicit drugs Their current medications and supplements The patient's functional ability including ADLs,fall risks, home safety risks, cognitive, and hearing and visual impairment Diet and physical activities Evidence for depression or mood disorders  The patient's  weight, height, BMI have been recorded in the chart.  I have made referrals, counseling,  and provided education to the patient based on review of the above and I have provided the patient with a written personalized care plan for preventive services.

## 2020-12-06 ENCOUNTER — Encounter: Payer: Self-pay | Admitting: Family Medicine

## 2020-12-06 ENCOUNTER — Ambulatory Visit: Payer: Medicare PPO | Admitting: Family Medicine

## 2020-12-06 ENCOUNTER — Other Ambulatory Visit: Payer: Self-pay

## 2020-12-06 VITALS — BP 122/78 | HR 72 | Ht 61.0 in | Wt 150.0 lb

## 2020-12-06 DIAGNOSIS — I700 Atherosclerosis of aorta: Secondary | ICD-10-CM

## 2020-12-06 DIAGNOSIS — M1A9XX0 Chronic gout, unspecified, without tophus (tophi): Secondary | ICD-10-CM | POA: Diagnosis not present

## 2020-12-06 DIAGNOSIS — E782 Mixed hyperlipidemia: Secondary | ICD-10-CM | POA: Diagnosis not present

## 2020-12-06 DIAGNOSIS — I10 Essential (primary) hypertension: Secondary | ICD-10-CM

## 2020-12-06 DIAGNOSIS — Z0000 Encounter for general adult medical examination without abnormal findings: Secondary | ICD-10-CM

## 2020-12-06 DIAGNOSIS — Z5181 Encounter for therapeutic drug level monitoring: Secondary | ICD-10-CM

## 2020-12-06 DIAGNOSIS — Z7185 Encounter for immunization safety counseling: Secondary | ICD-10-CM | POA: Diagnosis not present

## 2020-12-06 DIAGNOSIS — H8102 Meniere's disease, left ear: Secondary | ICD-10-CM | POA: Diagnosis not present

## 2020-12-06 DIAGNOSIS — E039 Hypothyroidism, unspecified: Secondary | ICD-10-CM | POA: Diagnosis not present

## 2020-12-06 DIAGNOSIS — M8580 Other specified disorders of bone density and structure, unspecified site: Secondary | ICD-10-CM

## 2020-12-06 MED ORDER — ALLOPURINOL 100 MG PO TABS: 100.0000 mg | ORAL_TABLET | Freq: Every day | ORAL | 3 refills | Status: DC

## 2020-12-06 MED ORDER — POTASSIUM CHLORIDE CRYS ER 20 MEQ PO TBCR: EXTENDED_RELEASE_TABLET | ORAL | 3 refills | Status: DC

## 2020-12-06 MED ORDER — FUROSEMIDE 20 MG PO TABS: 20.0000 mg | ORAL_TABLET | Freq: Every day | ORAL | 3 refills | Status: DC

## 2020-12-06 MED ORDER — SYNTHROID 75 MCG PO TABS: ORAL_TABLET | ORAL | 3 refills | Status: DC

## 2020-12-06 MED ORDER — LOSARTAN POTASSIUM 100 MG PO TABS: 100.0000 mg | ORAL_TABLET | Freq: Every day | ORAL | 3 refills | Status: DC

## 2020-12-06 MED ORDER — FENOFIBRATE 54 MG PO TABS: ORAL_TABLET | ORAL | 1 refills | Status: DC

## 2020-12-06 MED ORDER — ROSUVASTATIN CALCIUM 20 MG PO TABS: 20.0000 mg | ORAL_TABLET | Freq: Every day | ORAL | 1 refills | Status: DC

## 2020-12-18 ENCOUNTER — Telehealth: Payer: Self-pay | Admitting: Family Medicine

## 2020-12-18 NOTE — Telephone Encounter (Signed)
Received requested records from [facility] 

## 2020-12-20 ENCOUNTER — Encounter: Payer: Self-pay | Admitting: *Deleted

## 2021-01-15 ENCOUNTER — Other Ambulatory Visit: Payer: Self-pay | Admitting: Family Medicine

## 2021-01-15 DIAGNOSIS — Z1231 Encounter for screening mammogram for malignant neoplasm of breast: Secondary | ICD-10-CM

## 2021-01-23 ENCOUNTER — Other Ambulatory Visit (INDEPENDENT_AMBULATORY_CARE_PROVIDER_SITE_OTHER): Payer: Medicare PPO

## 2021-01-23 ENCOUNTER — Other Ambulatory Visit: Payer: Self-pay

## 2021-01-23 DIAGNOSIS — Z23 Encounter for immunization: Secondary | ICD-10-CM | POA: Diagnosis not present

## 2021-02-06 ENCOUNTER — Other Ambulatory Visit: Payer: Self-pay

## 2021-02-06 ENCOUNTER — Ambulatory Visit
Admission: RE | Admit: 2021-02-06 | Discharge: 2021-02-06 | Disposition: A | Discharge status: Home / Self Care | Payer: Medicare PPO | Source: Ambulatory Visit

## 2021-02-06 DIAGNOSIS — Z1231 Encounter for screening mammogram for malignant neoplasm of breast: Secondary | ICD-10-CM | POA: Diagnosis not present

## 2021-02-28 DIAGNOSIS — H2513 Age-related nuclear cataract, bilateral: Secondary | ICD-10-CM | POA: Diagnosis not present

## 2021-02-28 DIAGNOSIS — H401132 Primary open-angle glaucoma, bilateral, moderate stage: Secondary | ICD-10-CM | POA: Diagnosis not present

## 2021-05-27 ENCOUNTER — Other Ambulatory Visit: Payer: Self-pay | Admitting: Family Medicine

## 2021-05-27 DIAGNOSIS — E782 Mixed hyperlipidemia: Secondary | ICD-10-CM

## 2021-06-18 ENCOUNTER — Other Ambulatory Visit: Payer: Medicare Other

## 2021-06-18 DIAGNOSIS — E782 Mixed hyperlipidemia: Secondary | ICD-10-CM

## 2021-06-18 DIAGNOSIS — Z5181 Encounter for therapeutic drug level monitoring: Secondary | ICD-10-CM

## 2021-06-19 LAB — COMPREHENSIVE METABOLIC PANEL
ALT: 16 IU/L (ref 0–32)
AST: 17 IU/L (ref 0–40)
Albumin/Globulin Ratio: 1.9 (ref 1.2–2.2)
Albumin: 4.4 g/dL (ref 3.7–4.7)
Alkaline Phosphatase: 36 IU/L — ABNORMAL LOW (ref 44–121)
BUN/Creatinine Ratio: 27 (ref 12–28)
BUN: 27 mg/dL (ref 8–27)
Bilirubin Total: 0.4 mg/dL (ref 0.0–1.2)
CO2: 21 mmol/L (ref 20–29)
Calcium: 10.1 mg/dL (ref 8.7–10.3)
Chloride: 104 mmol/L (ref 96–106)
Creatinine, Ser: 1 mg/dL (ref 0.57–1.00)
Globulin, Total: 2.3 g/dL (ref 1.5–4.5)
Glucose: 86 mg/dL (ref 70–99)
Potassium: 4.6 mmol/L (ref 3.5–5.2)
Sodium: 141 mmol/L (ref 134–144)
Total Protein: 6.7 g/dL (ref 6.0–8.5)
eGFR: 57 mL/min/{1.73_m2} — ABNORMAL LOW (ref >59)

## 2021-06-19 LAB — LIPID PANEL
Chol/HDL Ratio: 3.6 ratio (ref 0.0–4.4)
Cholesterol, Total: 155 mg/dL (ref 100–199)
HDL: 43 mg/dL (ref >39)
LDL Chol Calc (NIH): 90 mg/dL (ref 0–99)
Triglycerides: 123 mg/dL (ref 0–149)
VLDL Cholesterol Cal: 22 mg/dL (ref 5–40)

## 2021-06-20 NOTE — Progress Notes (Signed)
Chief Complaint  ?Patient presents with  ? Hypertension  ?  Nonfasting med check. No new concerns.   ? ? ?Patient presents for 6 month f/u on chronic problems. She had labs done prior to her visit, see below. ? ?Hypertension:  She is currently taking losartan 115m daily and denies side effects (also on furosemide for Meniere's). She follows a low sodium diet. ?BP's are no longer being monitored at home. ?Denies headaches, dizziness, chest pain, edema, shortness of breath. ?BP Readings from Last 3 Encounters:  ?06/21/21 128/78  ?12/06/20 122/78  ?05/31/20 130/80  ? ?  ?Sciatica--She had MRI 05/2020, followed by 2 injections at [facility] with Dr. WThedore Mins(in March and August).  She had some back pain when she first got back from her [place]-long [place] trip, but no sciatica.  No pain since on the keto diet, she even reports less pain in her hands.   ?  ?Mixed hyperlipidemia:  She has been trying to follow a lowfat diet. She reports compliance with her medications (Crestor, fenofibrate and fish oil, 4x/d), and has no side effects.   ?Doing keto diet since 05/30/21, and is proud that during that time she has had only 1 breadstick, a few small biscuits and a small amount of pasta.  ?She has been eating a lot of hard boiled eggs (loves the yolks), not much cheese, very little red meat. No fried foods. Lots of fish, chicken salads. Eats a lot of berries and melon. ?She has lost 2 inches in the waist, 5# on home scale since starting Keto. ? ?Meniere's disease--She is taking furosemide (and potassium) without side effects or muscle cramps. Hasn't had any flares of vertigo in the last few years. She has decreased hearing in the left ear and uses a hearing aid.  She is under the care of Dr. TBenjamine Mola sees him yearly, but not recently. ?  ?Gout--no flare in a few years.  No side effects to allopurinol, and uric acid level was at goal on last check. ?Lab Results  ?Component Value Date  ? LABURIC 6.0 12/01/2020   ? ? ?Hypothyroidism:  She is compliant with taking her Synthroid on empty stomach, separate from other medications. She never really had any symptoms related to her thyroid. She denies any changes in hair (slight thinning from age)/skin/bowels/moods. She denies any missed doses.  TSH was normal on last check. ?Lab Results  ?Component Value Date  ? TSH 1.410 12/01/2020  ? ?  ?Atherosclerotic plaque was noted bilaterally on carotid UKoreadone 12/2018.  There was no significant stenosis. She was also noted to have aortic atherosclerosis, on [place](done to screen for AAA) in 2017. She is compliant with taking statin. She denies any neuro symptoms ? ? ? ?PMH, PSH, SH reviewed ? ?Outpatient Encounter Medications as of 06/21/2021  ?Medication Sig Note  ? allopurinol (ZYLOPRIM) 100 MG tablet Take 1 tablet (100 mg total) by mouth daily.   ? cholecalciferol (VITAMIN D3) 25 MCG (1000 UNIT) tablet Take 1,000 Units by mouth daily.   ? fenofibrate 54 MG tablet TAKE 1 TABLET(54 MG) BY MOUTH DAILY   ? furosemide (LASIX) 20 MG tablet Take 1 tablet (20 mg total) by mouth daily.   ? latanoprost (XALATAN) 0.005 % ophthalmic solution 1 drop at bedtime.   ? losartan (COZAAR) 100 MG tablet Take 1 tablet (100 mg total) by mouth daily.   ? Omega-3 Fatty Acids (FISH OIL) 1200 MG CAPS Take 4 capsules by mouth daily.  12/06/2020:  Takes 2 BID  ? potassium chloride SA (KLOR-CON) 20 MEQ tablet TAKE 1 TABLET(20 MEQ) BY MOUTH DAILY   ? rosuvastatin (CRESTOR) 20 MG tablet TAKE 1 TABLET(20 MG) BY MOUTH DAILY   ? SYNTHROID 75 MCG tablet TAKE 1 TABLET (75 MCG TOTAL) DAILY BEFORE BREAKFAST.   ? [DISCONTINUED] Travoprost, BAK Free, (TRAVATAN) 0.004 % SOLN ophthalmic solution Place 1 drop into both eyes at bedtime.   ? ?Facility-Administered Encounter Medications as of 06/21/2021  ?Medication  ? influenza  inactive virus vaccine (FLUZONE/FLUARIX) injection 0.5 mL  ? ?Allergies  ?Allergen Reactions  ? Lisinopril Cough  ? Penicillins Rash  ? ? ?ROS: no fever, chills,  headaches, dizziness, URI symptoms, cough, shortness of breath, chest pain, palpitations, heartburn.  Denies urinary complaints, bleeding, bruising, rash. No thyroid symptoms. Moods are good. ?No further sciatica, denies back pain, numbness, tingling, weakness. ?No recent vertigo. ?  ? ? ?PHYSICAL EXAM: ? ?BP 128/78   Pulse 84   Ht 5' 1" (1.549 m)   Wt 149 lb 6.4 oz (67.8 kg)   BMI 28.23 kg/m?  ? ?Wt Readings from Last 3 Encounters:  ?06/21/21 149 lb 6.4 oz (67.8 kg)  ?12/06/20 150 lb (68 kg)  ?05/31/20 153 lb 9.6 oz (69.7 kg)  ? ?Well appearing, pleasant female in no distress ?HEENT: conjunctiva and sclera clear, EOMI, wearing mask ?Neck: no lymphadenopathy or mass, no bruit. ?Heart: regular rate and rhythm ?Lungs: clear bilaterally ?Back: no CVA or spinal tenderness, no SI tenderness. ?Abdomen: soft, nontender, no mass ?Extremities: no edema, normal pulses ?Skin: normal turgor, no rash/lesions ?Psych: normal mood, affect, hygiene and grooming ?Neuro: alert and oriented, normal gait.  ? ?Fasting glucose 86 ? ?  Chemistry   ?   ?Component Value Date/Time  ? NA 141 06/18/2021 0832  ? K 4.6 06/18/2021 0832  ? CL 104 06/18/2021 0832  ? CO2 21 06/18/2021 0832  ? BUN 27 06/18/2021 0832  ? CREATININE 1.00 06/18/2021 0832  ? CREATININE 0.95 (H) 11/12/2016 0920  ?    ?Component Value Date/Time  ? CALCIUM 10.1 06/18/2021 0832  ? ALKPHOS 36 (L) 06/18/2021 0388  ? AST 17 06/18/2021 0832  ? ALT 16 06/18/2021 0832  ? BILITOT 0.4 06/18/2021 8280  ?  ? ?Lab Results  ?Component Value Date  ? CHOL 155 06/18/2021  ? HDL 43 06/18/2021  ? [place] 90 06/18/2021  ? TRIG 123 06/18/2021  ? CHOLHDL 3.6 06/18/2021  ? ? ? ?ASSESSMENT/PLAN: ? ?Mixed hyperlipidemia - well controlled on current regimen, cont ? ?Essential hypertension, benign - well controlled ? ?Aortic atherosclerosis ([facility]) - cont statin ? ?Chronic gout without tophus, unspecified cause, unspecified site - controlled with allopurinol, cont ? ?Meniere disease, left - well  controlled ? ?Hypothyroidism, unspecified type - euthyroid; adequately replaced per last labs, cont current dose ? ?Need for pneumococcal vaccination - Plan: Pneumococcal polysaccharide vaccine 23-valent greater than or equal to 2yo subcutaneous/IM ? ?Vaccine counseling - discussed recommendations for Tdap and Shingrix, to get from pharmacy. SE and timing reviewed ? ? ?F/u in 11/2021 as scheduled for wellness visit. ?Fasting labs prior--TSH, uric acid, c-met, lipids, CBC ? ?

## 2021-06-20 NOTE — Patient Instructions (Signed)
I recommend getting the new shingles vaccine (Shingrix). Since you have Medicare, you will need to get this from the pharmacy, as it is covered by Part D. ?This is a series of 2 injections, spaced 2 months apart.   ?This should be separated from other vaccines by at least 2 weeks. ? ?As of 04/01/21, there is no longer an out of pocket cost for the shingles vaccine or tetanus booster. ? ?You are also going to be due for your tetanus booster by the end of the year.  You will need to get this from the pharmacy also (and should be separated from other vaccines by at least 2 weeks). ? ?

## 2021-06-21 ENCOUNTER — Ambulatory Visit: Payer: Medicare Other | Admitting: Family Medicine

## 2021-06-21 ENCOUNTER — Encounter: Payer: Self-pay | Admitting: Family Medicine

## 2021-06-21 VITALS — BP 128/78 | HR 84 | Ht 61.0 in | Wt 149.4 lb

## 2021-06-21 DIAGNOSIS — M1A9XX0 Chronic gout, unspecified, without tophus (tophi): Secondary | ICD-10-CM

## 2021-06-21 DIAGNOSIS — E782 Mixed hyperlipidemia: Secondary | ICD-10-CM | POA: Diagnosis not present

## 2021-06-21 DIAGNOSIS — I700 Atherosclerosis of aorta: Secondary | ICD-10-CM | POA: Diagnosis not present

## 2021-06-21 DIAGNOSIS — Z7185 Encounter for immunization safety counseling: Secondary | ICD-10-CM

## 2021-06-21 DIAGNOSIS — E039 Hypothyroidism, unspecified: Secondary | ICD-10-CM

## 2021-06-21 DIAGNOSIS — I10 Essential (primary) hypertension: Secondary | ICD-10-CM

## 2021-06-21 DIAGNOSIS — H8102 Meniere's disease, left ear: Secondary | ICD-10-CM

## 2021-06-21 DIAGNOSIS — Z23 Encounter for immunization: Secondary | ICD-10-CM

## 2021-06-21 DIAGNOSIS — Z5181 Encounter for therapeutic drug level monitoring: Secondary | ICD-10-CM

## 2021-08-25 ENCOUNTER — Other Ambulatory Visit: Payer: Self-pay | Admitting: Family Medicine

## 2021-08-25 DIAGNOSIS — H8102 Meniere's disease, left ear: Secondary | ICD-10-CM

## 2021-08-25 DIAGNOSIS — E782 Mixed hyperlipidemia: Secondary | ICD-10-CM

## 2021-09-24 ENCOUNTER — Other Ambulatory Visit: Payer: Self-pay | Admitting: *Deleted

## 2021-09-24 DIAGNOSIS — M1A9XX0 Chronic gout, unspecified, without tophus (tophi): Secondary | ICD-10-CM

## 2021-09-24 DIAGNOSIS — H8102 Meniere's disease, left ear: Secondary | ICD-10-CM

## 2021-09-24 DIAGNOSIS — I10 Essential (primary) hypertension: Secondary | ICD-10-CM

## 2021-09-24 DIAGNOSIS — E039 Hypothyroidism, unspecified: Secondary | ICD-10-CM

## 2021-09-24 MED ORDER — POTASSIUM CHLORIDE CRYS ER 20 MEQ PO TBCR: EXTENDED_RELEASE_TABLET | ORAL | 0 refills | Status: DC

## 2021-09-24 MED ORDER — ALLOPURINOL 100 MG PO TABS: 100.0000 mg | ORAL_TABLET | Freq: Every day | ORAL | 0 refills | Status: DC

## 2021-09-24 MED ORDER — FUROSEMIDE 20 MG PO TABS: 20.0000 mg | ORAL_TABLET | Freq: Every day | ORAL | 0 refills | Status: DC

## 2021-09-24 MED ORDER — SYNTHROID 75 MCG PO TABS: ORAL_TABLET | ORAL | 0 refills | Status: AC

## 2021-09-24 MED ORDER — LOSARTAN POTASSIUM 100 MG PO TABS: 100.0000 mg | ORAL_TABLET | Freq: Every day | ORAL | 0 refills | Status: DC

## 2021-11-20 ENCOUNTER — Other Ambulatory Visit: Payer: Self-pay

## 2021-11-20 ENCOUNTER — Telehealth: Payer: Self-pay

## 2021-11-20 DIAGNOSIS — M1A9XX0 Chronic gout, unspecified, without tophus (tophi): Secondary | ICD-10-CM

## 2021-11-20 DIAGNOSIS — E782 Mixed hyperlipidemia: Secondary | ICD-10-CM

## 2021-11-20 DIAGNOSIS — H8102 Meniere's disease, left ear: Secondary | ICD-10-CM

## 2021-11-20 DIAGNOSIS — I10 Essential (primary) hypertension: Secondary | ICD-10-CM

## 2021-11-20 MED ORDER — POTASSIUM CHLORIDE CRYS ER 20 MEQ PO TBCR: EXTENDED_RELEASE_TABLET | ORAL | 0 refills | Status: AC

## 2021-11-20 MED ORDER — ALLOPURINOL 100 MG PO TABS: 100.0000 mg | ORAL_TABLET | Freq: Every day | ORAL | 0 refills | Status: AC

## 2021-11-20 MED ORDER — LOSARTAN POTASSIUM 100 MG PO TABS: 100.0000 mg | ORAL_TABLET | Freq: Every day | ORAL | 0 refills | Status: AC

## 2021-11-20 MED ORDER — ROSUVASTATIN CALCIUM 20 MG PO TABS: ORAL_TABLET | ORAL | 0 refills | Status: AC

## 2021-11-20 NOTE — Telephone Encounter (Signed)
Pt. Called requesting a refill on her Allopurinol, Losartan, Potassium, and Rosuvastatin. But she is currently in [place]. She wanted to know if you could send them to [facility] at [facility]. [address] [place]. [id number].

## 2021-11-20 NOTE — Telephone Encounter (Signed)
She cancelled her upcoming physical/AWV. I believe she is MOVING to [facility].  Okay to RF her meds x 90d, which hopefully should cover until she finds a new doctor (will be due for visit, labs).  If confirmed that she is moving, not returning, remove me as PCP

## 2021-11-20 NOTE — Telephone Encounter (Signed)
Spoke to pt and she is moving and said she is in the process of finding a PCP there. Meds sent for 90 days and I removed you as her pcp.

## 2021-11-23 ENCOUNTER — Other Ambulatory Visit: Payer: Self-pay | Admitting: Family Medicine

## 2021-11-23 DIAGNOSIS — I10 Essential (primary) hypertension: Secondary | ICD-10-CM

## 2021-11-23 DIAGNOSIS — H8102 Meniere's disease, left ear: Secondary | ICD-10-CM

## 2021-12-13 ENCOUNTER — Other Ambulatory Visit: Payer: Medicare Other

## 2021-12-17 ENCOUNTER — Ambulatory Visit: Payer: Medicare PPO | Admitting: Family Medicine

## 2022-02-16 ENCOUNTER — Other Ambulatory Visit: Payer: Self-pay | Admitting: Family Medicine

## 2022-02-16 DIAGNOSIS — E782 Mixed hyperlipidemia: Secondary | ICD-10-CM

## 2022-02-16 DIAGNOSIS — I10 Essential (primary) hypertension: Secondary | ICD-10-CM

## 2022-02-18 ENCOUNTER — Telehealth: Payer: Self-pay | Admitting: *Deleted

## 2022-02-18 DIAGNOSIS — H8102 Meniere's disease, left ear: Secondary | ICD-10-CM

## 2022-02-18 MED ORDER — FUROSEMIDE 20 MG PO TABS: 20.0000 mg | ORAL_TABLET | Freq: Every day | ORAL | 0 refills | Status: AC

## 2022-02-18 NOTE — Telephone Encounter (Signed)
Patient called and asked if you would call in lasix for her. Has appt 04/16/22 with new provider in [place]. She misses you! [place], [place].
